# Patient Record
Sex: Female | Born: 1980 | Race: Black or African American | Hispanic: No | Marital: Single | Smoking: Current some day smoker
Health system: Southern US, Community
[De-identification: ages and names within clinical notes are randomized; demographics above are authoritative.]

## PROBLEM LIST (undated history)

## (undated) ENCOUNTER — Inpatient Hospital Stay (HOSPITAL_COMMUNITY): Payer: Self-pay

## (undated) DIAGNOSIS — E079 Disorder of thyroid, unspecified: Secondary | ICD-10-CM

## (undated) DIAGNOSIS — E669 Obesity, unspecified: Secondary | ICD-10-CM

## (undated) DIAGNOSIS — D649 Anemia, unspecified: Secondary | ICD-10-CM

## (undated) DIAGNOSIS — D573 Sickle-cell trait: Secondary | ICD-10-CM

## (undated) HISTORY — PX: WISDOM TOOTH EXTRACTION: SHX21

## (undated) HISTORY — PX: FINGER NAIL SURGERY: SHX717

## (undated) HISTORY — PX: KNEE SURGERY: SHX244

---

## 1999-01-22 ENCOUNTER — Inpatient Hospital Stay (HOSPITAL_COMMUNITY): Admission: AD | Admit: 1999-01-22 | Discharge: 1999-01-22 | Payer: Self-pay | Admitting: *Deleted

## 1999-03-28 ENCOUNTER — Emergency Department (HOSPITAL_COMMUNITY): Admission: EM | Admit: 1999-03-28 | Discharge: 1999-03-28 | Payer: Self-pay | Admitting: Emergency Medicine

## 2000-08-16 ENCOUNTER — Emergency Department (HOSPITAL_COMMUNITY): Admission: EM | Admit: 2000-08-16 | Discharge: 2000-08-16 | Payer: Self-pay | Admitting: *Deleted

## 2000-12-05 ENCOUNTER — Inpatient Hospital Stay (HOSPITAL_COMMUNITY): Admission: AD | Admit: 2000-12-05 | Discharge: 2000-12-05 | Payer: Self-pay | Admitting: Obstetrics

## 2001-05-22 ENCOUNTER — Encounter: Payer: Self-pay | Admitting: Family Medicine

## 2001-05-22 ENCOUNTER — Ambulatory Visit (HOSPITAL_COMMUNITY): Admission: RE | Admit: 2001-05-22 | Discharge: 2001-05-22 | Payer: Self-pay | Admitting: Family Medicine

## 2001-10-18 ENCOUNTER — Emergency Department (HOSPITAL_COMMUNITY): Admission: EM | Admit: 2001-10-18 | Discharge: 2001-10-18 | Payer: Self-pay | Admitting: *Deleted

## 2001-10-28 ENCOUNTER — Emergency Department (HOSPITAL_COMMUNITY): Admission: EM | Admit: 2001-10-28 | Discharge: 2001-10-28 | Payer: Self-pay | Admitting: Emergency Medicine

## 2002-02-15 ENCOUNTER — Inpatient Hospital Stay (HOSPITAL_COMMUNITY): Admission: AD | Admit: 2002-02-15 | Discharge: 2002-02-15 | Payer: Self-pay | Admitting: *Deleted

## 2002-02-18 ENCOUNTER — Other Ambulatory Visit: Admission: RE | Admit: 2002-02-18 | Discharge: 2002-02-18 | Payer: Self-pay | Admitting: Obstetrics and Gynecology

## 2002-12-28 ENCOUNTER — Emergency Department (HOSPITAL_COMMUNITY): Admission: EM | Admit: 2002-12-28 | Discharge: 2002-12-28 | Payer: Self-pay | Admitting: Emergency Medicine

## 2003-08-24 ENCOUNTER — Inpatient Hospital Stay (HOSPITAL_COMMUNITY): Admission: AD | Admit: 2003-08-24 | Discharge: 2003-08-24 | Payer: Self-pay | Admitting: *Deleted

## 2003-08-26 ENCOUNTER — Inpatient Hospital Stay (HOSPITAL_COMMUNITY): Admission: AD | Admit: 2003-08-26 | Discharge: 2003-08-26 | Payer: Self-pay | Admitting: Obstetrics and Gynecology

## 2003-08-31 ENCOUNTER — Inpatient Hospital Stay (HOSPITAL_COMMUNITY): Admission: AD | Admit: 2003-08-31 | Discharge: 2003-08-31 | Payer: Self-pay | Admitting: Obstetrics & Gynecology

## 2003-12-09 ENCOUNTER — Inpatient Hospital Stay (HOSPITAL_COMMUNITY): Admission: AD | Admit: 2003-12-09 | Discharge: 2003-12-09 | Payer: Self-pay | Admitting: Obstetrics

## 2004-02-12 ENCOUNTER — Inpatient Hospital Stay (HOSPITAL_COMMUNITY): Admission: AD | Admit: 2004-02-12 | Discharge: 2004-02-12 | Payer: Self-pay | Admitting: Obstetrics

## 2004-03-05 ENCOUNTER — Inpatient Hospital Stay (HOSPITAL_COMMUNITY): Admission: AD | Admit: 2004-03-05 | Discharge: 2004-03-07 | Payer: Self-pay | Admitting: Obstetrics

## 2004-03-13 ENCOUNTER — Inpatient Hospital Stay (HOSPITAL_COMMUNITY): Admission: AD | Admit: 2004-03-13 | Discharge: 2004-03-15 | Payer: Self-pay | Admitting: Obstetrics

## 2004-03-21 ENCOUNTER — Ambulatory Visit (HOSPITAL_COMMUNITY): Admission: RE | Admit: 2004-03-21 | Discharge: 2004-03-21 | Payer: Self-pay | Admitting: Obstetrics

## 2004-03-21 ENCOUNTER — Observation Stay (HOSPITAL_COMMUNITY): Admission: AD | Admit: 2004-03-21 | Discharge: 2004-03-22 | Payer: Self-pay | Admitting: Obstetrics

## 2004-03-22 ENCOUNTER — Inpatient Hospital Stay (HOSPITAL_COMMUNITY): Admission: AD | Admit: 2004-03-22 | Discharge: 2004-03-24 | Payer: Self-pay | Admitting: Obstetrics

## 2004-07-26 ENCOUNTER — Inpatient Hospital Stay (HOSPITAL_COMMUNITY): Admission: AD | Admit: 2004-07-26 | Discharge: 2004-07-27 | Payer: Self-pay | Admitting: Obstetrics

## 2005-02-23 ENCOUNTER — Emergency Department (HOSPITAL_COMMUNITY): Admission: EM | Admit: 2005-02-23 | Discharge: 2005-02-23 | Payer: Self-pay | Admitting: Family Medicine

## 2005-02-25 ENCOUNTER — Inpatient Hospital Stay (HOSPITAL_COMMUNITY): Admission: AD | Admit: 2005-02-25 | Discharge: 2005-02-25 | Payer: Self-pay | Admitting: Obstetrics and Gynecology

## 2005-03-08 ENCOUNTER — Other Ambulatory Visit: Admission: RE | Admit: 2005-03-08 | Discharge: 2005-03-08 | Payer: Self-pay | Admitting: Obstetrics and Gynecology

## 2005-04-26 ENCOUNTER — Inpatient Hospital Stay (HOSPITAL_COMMUNITY): Admission: AD | Admit: 2005-04-26 | Discharge: 2005-04-26 | Payer: Self-pay | Admitting: Obstetrics and Gynecology

## 2005-05-17 ENCOUNTER — Emergency Department (HOSPITAL_COMMUNITY): Admission: EM | Admit: 2005-05-17 | Discharge: 2005-05-17 | Payer: Self-pay | Admitting: Emergency Medicine

## 2005-06-25 ENCOUNTER — Inpatient Hospital Stay (HOSPITAL_COMMUNITY): Admission: AD | Admit: 2005-06-25 | Discharge: 2005-06-25 | Payer: Self-pay | Admitting: Obstetrics and Gynecology

## 2005-09-18 ENCOUNTER — Inpatient Hospital Stay (HOSPITAL_COMMUNITY): Admission: AD | Admit: 2005-09-18 | Discharge: 2005-09-18 | Payer: Self-pay | Admitting: Obstetrics and Gynecology

## 2005-10-09 ENCOUNTER — Inpatient Hospital Stay (HOSPITAL_COMMUNITY): Admission: AD | Admit: 2005-10-09 | Discharge: 2005-10-10 | Payer: Self-pay | Admitting: Obstetrics and Gynecology

## 2005-10-15 ENCOUNTER — Inpatient Hospital Stay (HOSPITAL_COMMUNITY): Admission: AD | Admit: 2005-10-15 | Discharge: 2005-10-15 | Payer: Self-pay | Admitting: Obstetrics and Gynecology

## 2005-10-24 ENCOUNTER — Inpatient Hospital Stay (HOSPITAL_COMMUNITY): Admission: AD | Admit: 2005-10-24 | Discharge: 2005-10-26 | Payer: Self-pay | Admitting: Obstetrics and Gynecology

## 2006-03-05 ENCOUNTER — Other Ambulatory Visit: Admission: RE | Admit: 2006-03-05 | Discharge: 2006-03-05 | Payer: Self-pay | Admitting: Obstetrics and Gynecology

## 2006-04-12 ENCOUNTER — Inpatient Hospital Stay (HOSPITAL_COMMUNITY): Admission: AD | Admit: 2006-04-12 | Discharge: 2006-04-12 | Payer: Self-pay | Admitting: Obstetrics and Gynecology

## 2006-06-10 ENCOUNTER — Inpatient Hospital Stay (HOSPITAL_COMMUNITY): Admission: AD | Admit: 2006-06-10 | Discharge: 2006-06-10 | Payer: Self-pay | Admitting: Obstetrics and Gynecology

## 2006-07-17 ENCOUNTER — Ambulatory Visit (HOSPITAL_COMMUNITY): Admission: RE | Admit: 2006-07-17 | Discharge: 2006-07-17 | Payer: Self-pay | Admitting: Obstetrics and Gynecology

## 2006-07-26 ENCOUNTER — Inpatient Hospital Stay (HOSPITAL_COMMUNITY): Admission: AD | Admit: 2006-07-26 | Discharge: 2006-07-26 | Payer: Self-pay | Admitting: Obstetrics and Gynecology

## 2006-08-24 ENCOUNTER — Inpatient Hospital Stay (HOSPITAL_COMMUNITY): Admission: AD | Admit: 2006-08-24 | Discharge: 2006-08-24 | Payer: Self-pay | Admitting: Obstetrics and Gynecology

## 2006-09-09 ENCOUNTER — Inpatient Hospital Stay (HOSPITAL_COMMUNITY): Admission: AD | Admit: 2006-09-09 | Discharge: 2006-09-10 | Payer: Self-pay | Admitting: Obstetrics and Gynecology

## 2006-09-13 ENCOUNTER — Inpatient Hospital Stay (HOSPITAL_COMMUNITY): Admission: AD | Admit: 2006-09-13 | Discharge: 2006-09-14 | Payer: Self-pay | Admitting: Obstetrics and Gynecology

## 2006-09-22 ENCOUNTER — Inpatient Hospital Stay (HOSPITAL_COMMUNITY): Admission: AD | Admit: 2006-09-22 | Discharge: 2006-09-22 | Payer: Self-pay | Admitting: Obstetrics and Gynecology

## 2006-10-09 ENCOUNTER — Inpatient Hospital Stay (HOSPITAL_COMMUNITY): Admission: AD | Admit: 2006-10-09 | Discharge: 2006-10-09 | Payer: Self-pay | Admitting: Obstetrics and Gynecology

## 2006-10-16 ENCOUNTER — Inpatient Hospital Stay (HOSPITAL_COMMUNITY): Admission: RE | Admit: 2006-10-16 | Discharge: 2006-10-18 | Payer: Self-pay | Admitting: Obstetrics and Gynecology

## 2006-11-27 ENCOUNTER — Other Ambulatory Visit: Admission: RE | Admit: 2006-11-27 | Discharge: 2006-11-27 | Payer: Self-pay | Admitting: Obstetrics and Gynecology

## 2007-12-20 ENCOUNTER — Inpatient Hospital Stay (HOSPITAL_COMMUNITY): Admission: AD | Admit: 2007-12-20 | Discharge: 2007-12-20 | Payer: Self-pay | Admitting: Obstetrics and Gynecology

## 2008-03-17 ENCOUNTER — Emergency Department (HOSPITAL_COMMUNITY): Admission: EM | Admit: 2008-03-17 | Discharge: 2008-03-17 | Payer: Self-pay | Admitting: Emergency Medicine

## 2008-03-17 ENCOUNTER — Emergency Department (HOSPITAL_COMMUNITY): Admission: EM | Admit: 2008-03-17 | Discharge: 2008-03-17 | Payer: Self-pay | Admitting: Family Medicine

## 2008-03-20 ENCOUNTER — Emergency Department (HOSPITAL_COMMUNITY): Admission: EM | Admit: 2008-03-20 | Discharge: 2008-03-20 | Payer: Self-pay | Admitting: Emergency Medicine

## 2008-07-07 ENCOUNTER — Emergency Department (HOSPITAL_COMMUNITY): Admission: EM | Admit: 2008-07-07 | Discharge: 2008-07-07 | Payer: Self-pay | Admitting: Family Medicine

## 2008-12-07 ENCOUNTER — Inpatient Hospital Stay (HOSPITAL_COMMUNITY): Admission: AD | Admit: 2008-12-07 | Discharge: 2008-12-07 | Payer: Self-pay | Admitting: Obstetrics & Gynecology

## 2010-06-13 ENCOUNTER — Inpatient Hospital Stay (HOSPITAL_COMMUNITY): Admission: AD | Admit: 2010-06-13 | Discharge: 2010-06-13 | Payer: Self-pay | Admitting: Obstetrics

## 2010-06-13 ENCOUNTER — Ambulatory Visit: Payer: Self-pay | Admitting: Nurse Practitioner

## 2010-09-17 ENCOUNTER — Encounter: Payer: Self-pay | Admitting: Obstetrics and Gynecology

## 2010-09-18 ENCOUNTER — Encounter: Payer: Self-pay | Admitting: Obstetrics

## 2010-09-20 ENCOUNTER — Inpatient Hospital Stay (HOSPITAL_COMMUNITY)
Admission: AD | Admit: 2010-09-20 | Discharge: 2010-09-20 | Payer: Self-pay | Source: Home / Self Care | Attending: Obstetrics | Admitting: Obstetrics

## 2010-09-21 LAB — CBC
Hemoglobin: 9.6 g/dL — ABNORMAL LOW (ref 12.0–15.0)
Platelets: 268 10*3/uL (ref 150–400)
RBC: 4.06 MIL/uL (ref 3.87–5.11)
WBC: 10.2 10*3/uL (ref 4.0–10.5)

## 2010-09-21 LAB — URINE MICROSCOPIC-ADD ON

## 2010-09-21 LAB — URINALYSIS, ROUTINE W REFLEX MICROSCOPIC
Bilirubin Urine: NEGATIVE
Hgb urine dipstick: NEGATIVE
Specific Gravity, Urine: 1.015 (ref 1.005–1.030)
Urine Glucose, Fasting: NEGATIVE mg/dL
Urobilinogen, UA: 2 mg/dL — ABNORMAL HIGH (ref 0.0–1.0)
pH: 6 (ref 5.0–8.0)

## 2010-09-21 LAB — COMPREHENSIVE METABOLIC PANEL
ALT: 9 U/L (ref 0–35)
AST: 15 U/L (ref 0–37)
Albumin: 2.6 g/dL — ABNORMAL LOW (ref 3.5–5.2)
CO2: 18 mEq/L — ABNORMAL LOW (ref 19–32)
Chloride: 110 mEq/L (ref 96–112)
GFR calc Af Amer: >60 mL/min (ref >60)
GFR calc non Af Amer: >60 mL/min (ref >60)
Sodium: 138 mEq/L (ref 135–145)
Total Bilirubin: 0.6 mg/dL (ref 0.3–1.2)

## 2010-09-29 ENCOUNTER — Emergency Department (HOSPITAL_COMMUNITY)
Admission: EM | Admit: 2010-09-29 | Discharge: 2010-09-29 | Disposition: A | Discharge status: Nursing Home (Includes ICF) | Payer: Medicaid Other | Attending: Emergency Medicine | Admitting: Emergency Medicine

## 2010-09-29 ENCOUNTER — Inpatient Hospital Stay (HOSPITAL_COMMUNITY)
Admission: AD | Admit: 2010-09-29 | Discharge: 2010-09-29 | Disposition: A | Discharge status: Home / Self Care | Payer: Medicaid Other | Source: Ambulatory Visit | Attending: Obstetrics | Admitting: Obstetrics

## 2010-09-29 DIAGNOSIS — D573 Sickle-cell trait: Secondary | ICD-10-CM | POA: Insufficient documentation

## 2010-09-29 DIAGNOSIS — O99891 Other specified diseases and conditions complicating pregnancy: Secondary | ICD-10-CM | POA: Insufficient documentation

## 2010-09-29 DIAGNOSIS — Y92009 Unspecified place in unspecified non-institutional (private) residence as the place of occurrence of the external cause: Secondary | ICD-10-CM | POA: Insufficient documentation

## 2010-09-29 DIAGNOSIS — R109 Unspecified abdominal pain: Secondary | ICD-10-CM

## 2010-09-29 DIAGNOSIS — O368190 Decreased fetal movements, unspecified trimester, not applicable or unspecified: Secondary | ICD-10-CM

## 2010-09-29 DIAGNOSIS — O9989 Other specified diseases and conditions complicating pregnancy, childbirth and the puerperium: Secondary | ICD-10-CM

## 2010-09-29 DIAGNOSIS — IMO0002 Reserved for concepts with insufficient information to code with codable children: Secondary | ICD-10-CM | POA: Insufficient documentation

## 2010-09-29 DIAGNOSIS — Y929 Unspecified place or not applicable: Secondary | ICD-10-CM | POA: Insufficient documentation

## 2010-09-29 LAB — WET PREP, GENITAL
Trich, Wet Prep: NONE SEEN
Yeast Wet Prep HPF POC: NONE SEEN

## 2010-11-02 ENCOUNTER — Inpatient Hospital Stay (HOSPITAL_COMMUNITY)
Admission: AD | Admit: 2010-11-02 | Discharge: 2010-11-03 | Disposition: A | Discharge status: Home / Self Care | Payer: Medicaid Other | Source: Ambulatory Visit | Attending: Obstetrics | Admitting: Obstetrics

## 2010-11-02 DIAGNOSIS — O479 False labor, unspecified: Secondary | ICD-10-CM

## 2010-11-03 LAB — URINALYSIS, ROUTINE W REFLEX MICROSCOPIC
Glucose, UA: NEGATIVE mg/dL
Ketones, ur: >80 mg/dL — AB
Nitrite: NEGATIVE
Protein, ur: NEGATIVE mg/dL
pH: 6 (ref 5.0–8.0)

## 2010-11-03 LAB — CBC
HCT: 29.4 % — ABNORMAL LOW (ref 36.0–46.0)
Hemoglobin: 9.7 g/dL — ABNORMAL LOW (ref 12.0–15.0)
MCH: 22.6 pg — ABNORMAL LOW (ref 26.0–34.0)
MCHC: 33 g/dL (ref 30.0–36.0)
RDW: 14.4 % (ref 11.5–15.5)

## 2010-11-03 LAB — URINE MICROSCOPIC-ADD ON

## 2010-11-03 LAB — COMPREHENSIVE METABOLIC PANEL
BUN: 1 mg/dL — ABNORMAL LOW (ref 6–23)
CO2: 20 mEq/L (ref 19–32)
Calcium: 8.6 mg/dL (ref 8.4–10.5)
Creatinine, Ser: 0.55 mg/dL (ref 0.4–1.2)
GFR calc non Af Amer: >60 mL/min (ref >60)
Glucose, Bld: 91 mg/dL (ref 70–99)

## 2010-11-03 LAB — LACTATE DEHYDROGENASE: LDH: 140 U/L (ref 94–250)

## 2010-11-06 ENCOUNTER — Emergency Department (HOSPITAL_COMMUNITY)
Admission: EM | Admit: 2010-11-06 | Discharge: 2010-11-06 | Disposition: A | Discharge status: Home / Self Care | Payer: Medicaid Other | Attending: Emergency Medicine | Admitting: Emergency Medicine

## 2010-11-06 DIAGNOSIS — R220 Localized swelling, mass and lump, head: Secondary | ICD-10-CM | POA: Insufficient documentation

## 2010-11-06 DIAGNOSIS — K089 Disorder of teeth and supporting structures, unspecified: Secondary | ICD-10-CM | POA: Insufficient documentation

## 2010-11-06 DIAGNOSIS — D573 Sickle-cell trait: Secondary | ICD-10-CM | POA: Insufficient documentation

## 2010-11-06 DIAGNOSIS — O99891 Other specified diseases and conditions complicating pregnancy: Secondary | ICD-10-CM | POA: Insufficient documentation

## 2010-11-06 DIAGNOSIS — R221 Localized swelling, mass and lump, neck: Secondary | ICD-10-CM | POA: Insufficient documentation

## 2010-11-09 LAB — COMPREHENSIVE METABOLIC PANEL
AST: 20 U/L (ref 0–37)
Albumin: 2.9 g/dL — ABNORMAL LOW (ref 3.5–5.2)
Calcium: 8.7 mg/dL (ref 8.4–10.5)
Chloride: 106 mEq/L (ref 96–112)
Creatinine, Ser: 0.6 mg/dL (ref 0.4–1.2)
GFR calc Af Amer: >60 mL/min (ref >60)
Total Bilirubin: 0.4 mg/dL (ref 0.3–1.2)
Total Protein: 6.5 g/dL (ref 6.0–8.3)

## 2010-11-09 LAB — URINE CULTURE
Colony Count: >=100000
Culture  Setup Time: 201110180343

## 2010-11-09 LAB — DIFFERENTIAL
Eosinophils Relative: 2 % (ref 0–5)
Lymphocytes Relative: 24 % (ref 12–46)
Lymphs Abs: 2.1 10*3/uL (ref 0.7–4.0)
Monocytes Absolute: 0.4 10*3/uL (ref 0.1–1.0)
Monocytes Relative: 5 % (ref 3–12)
Neutro Abs: 6.2 10*3/uL (ref 1.7–7.7)

## 2010-11-09 LAB — CBC
MCH: 26 pg (ref 26.0–34.0)
Platelets: 249 10*3/uL (ref 150–400)
RBC: 3.92 MIL/uL (ref 3.87–5.11)
RDW: 14 % (ref 11.5–15.5)

## 2010-11-09 LAB — URINALYSIS, ROUTINE W REFLEX MICROSCOPIC
Protein, ur: NEGATIVE mg/dL
Urobilinogen, UA: 1 mg/dL (ref 0.0–1.0)

## 2010-11-09 LAB — WET PREP, GENITAL

## 2010-11-09 LAB — GC/CHLAMYDIA PROBE AMP, GENITAL
Chlamydia, DNA Probe: NEGATIVE
GC Probe Amp, Genital: NEGATIVE

## 2010-11-09 LAB — URINE MICROSCOPIC-ADD ON

## 2010-11-23 ENCOUNTER — Inpatient Hospital Stay (HOSPITAL_COMMUNITY)
Admission: RE | Admit: 2010-11-23 | Discharge: 2010-11-25 | DRG: 775 | Disposition: A | Discharge status: Home / Self Care | Payer: Medicaid Other | Source: Ambulatory Visit | Attending: Obstetrics | Admitting: Obstetrics

## 2010-11-23 DIAGNOSIS — O99892 Other specified diseases and conditions complicating childbirth: Principal | ICD-10-CM | POA: Diagnosis present

## 2010-11-23 DIAGNOSIS — Z22330 Carrier of Group B streptococcus: Secondary | ICD-10-CM

## 2010-11-23 LAB — CBC
Platelets: 226 10*3/uL (ref 150–400)
RBC: 4.16 MIL/uL (ref 3.87–5.11)
RDW: 15.4 % (ref 11.5–15.5)
WBC: 8.4 10*3/uL (ref 4.0–10.5)

## 2010-11-23 LAB — RPR: RPR Ser Ql: NONREACTIVE

## 2010-11-24 LAB — CBC
Hemoglobin: 7.4 g/dL — ABNORMAL LOW (ref 12.0–15.0)
MCH: 21.5 pg — ABNORMAL LOW (ref 26.0–34.0)
Platelets: 190 10*3/uL (ref 150–400)
RBC: 3.44 MIL/uL — ABNORMAL LOW (ref 3.87–5.11)

## 2010-12-07 LAB — URINALYSIS, ROUTINE W REFLEX MICROSCOPIC
Glucose, UA: NEGATIVE mg/dL
Ketones, ur: NEGATIVE mg/dL
Leukocytes, UA: NEGATIVE
Nitrite: POSITIVE — AB
Protein, ur: NEGATIVE mg/dL
pH: 6.5 (ref 5.0–8.0)

## 2010-12-07 LAB — WET PREP, GENITAL
Trich, Wet Prep: NONE SEEN
Yeast Wet Prep HPF POC: NONE SEEN

## 2010-12-07 LAB — URINE MICROSCOPIC-ADD ON

## 2010-12-08 LAB — POCT PREGNANCY, URINE: Preg Test, Ur: NEGATIVE

## 2011-01-13 NOTE — Discharge Summary (Signed)
Megan [name], BALDRIDGE           ACCOUNT NO.:  [id number]   MEDICAL RECORD NO.:  [id number]          PATIENT TYPE:  INP   LOCATION:  9116                          [facility]   PHYSICIAN:  James A. Ashley Royalty, M.D.DATE OF BIRTH:  03-16-1981   DATE OF ADMISSION:  10/24/2005  DATE OF DISCHARGE:  10/26/2005                                 DISCHARGE SUMMARY   DISCHARGE DIAGNOSES:  1.  Intrauterine pregnancy at 39 weeks, 5 days gestation, delivered.  2.  Sickle trait.  3.  Heterozygote for cystic fibrosis.  4.  Muscular discomfort.  5.  Term birth living child vertex.   OPERATIONS AND PROCEDURES:  OB delivery.   CONSULTATIONS:  None.   DISCHARGE MEDICATIONS:  1.  Motrin 600 mg.  2.  Chromagen.   HISTORY AND PHYSICAL:  This is a 30 year old gravida 2, para 1 at 39 weeks 5  days gestation. Interim care was complicated by sickle trait and positive  carrier status for cystic fibrosis. Father of the baby was not available for  any testing. The patient was scheduled for induction due to musculoskeletal  discomfort with advanced cervical changes at term. However, she presented  October 24, 2005 approximately 5 a.m. complaining of regular contractions.  She was noted to be 5 cm dilated in maternity admissions. For the remainder  of the history and physical, please see chart.   HOSPITAL COURSE:  The patient was admitted to [facility] for  [place]. Admission laboratory studies were drawn. She went on to labor  and deliver on October 24, 2005. It is a 7-pound, 11-ounce female, Apgars 7  at one minute, 9 at five minutes, sent to the newborn nursery. Delivery was  accomplished by Dr. Richardson Dopp over an intact perineum. There were no lacerations  or other complications. The patient's postpartum course was benign. She was  discharged the second postpartum day afebrile and in satisfactory condition.   DISPOSITION:  The patient is to return to [facility] and Obstetrics  in four to  six weeks for postpartum evaluation.      James A. Ashley Royalty, M.D.  Electronically Signed     JAM/MEDQ  D:  11/27/2005  T:  11/29/2005  Job:  [id number]

## 2011-01-13 NOTE — Discharge Summary (Signed)
NAMESWAN, FAIRFAX                     ACCOUNT NO.:  [id number]   MEDICAL RECORD NO.:  [id number]                   PATIENT TYPE:  INP   LOCATION:  9143                                 [facility]   PHYSICIAN:  Kathreen Cosier, M.D.           DATE OF BIRTH:  23-Sep-1980   DATE OF ADMISSION:  03/05/2004  DATE OF DISCHARGE:  03/07/2004                                 DISCHARGE SUMMARY   HOSPITAL COURSE:  The patient is a 30 year old gravida 1 at [redacted] weeks  gestation who was admitted with premature contractions.  Her labs are  normal, she had no decelerations, and she also had urinary tract infection.  She received IV hydration and IV Rocephin and her contractions ceased.  On  March 07, 2004 she had an ultrasound which was normal, and she was discharged  home on Macrobid one p.o. b.i.d. for 7 days, Procardia 60 mg p.o. daily for  premature contractions.   DISCHARGE DIAGNOSIS:  Status post intrauterine pregnancy at 33+ weeks with  urinary tract infection and premature contractions.                                               Kathreen Cosier, M.D.    BAM/MEDQ  D:  05/04/2004  T:  05/04/2004  Job:  [id number]

## 2011-01-13 NOTE — Discharge Summary (Signed)
Megan [name], Megan [name]           ACCOUNT NO.:  [id number]   MEDICAL RECORD NO.:  [id number]          PATIENT TYPE:  INP   LOCATION:  9136                          [facility]   PHYSICIAN:  James A. Ashley Royalty, M.D.DATE OF BIRTH:  1981-03-11   DATE OF ADMISSION:  10/16/2006  DATE OF DISCHARGE:  10/18/2006                               DISCHARGE SUMMARY   DISCHARGE DIAGNOSES:  1. Intrauterine pregnancy at 35 weeks' gestation, delivered.  2. History of noncompliance with obstetrical/gynecological care.  3. Term-birth living child, vertex.   OPERATIONS AND PROCEDURES:  OB delivery.   CONSULTATIONS:  None.   DISCHARGE MEDICATIONS:  1. Iron.  2. Motrin 600 mg.   HISTORY AND PHYSICAL:  This is a 23-year gravida 3, para 2 at 39-weeks 1-  day gestation.  Prenatal care complicated by the aforementioned  diagnoses as well as sickle trait.  The patient was admitted for  induction secondary to advanced cervical changes and noncompliance with  OB/GYN care.  For the remainder of the history and physical, please see chart.   HOSPITAL COURSE:  The patient was admitted to the [facility] of  [place].  Admission laboratory studies were drawn.  Artificial  rupture of membranes was accomplished.  She went on to labor and deliver  on October 16, 2006.  The infant was a 6-pound 15-ounce female, Apgars 8  at one minute and 9 at five minutes to the newborn nursery.  Delivery  was accomplished by Dr. Sylvester Harder over an intact perineum.  There  was a first-degree periurethral laceration which was repaired without  difficulty.  The patient's postpartum course was benign save for a  modest asymptomatic anemia.  She was felt to be stable for discharge on  October 18, 2006 and was discharged home afebrile and in satisfactory  condition.   DISPOSITION:  The patient is to return to [facility] and  Obstetrics in 6 weeks for postpartum evaluation.      James A. Ashley Royalty, M.D.  Electronically Signed     JAM/MEDQ  D:  11/28/2006  T:  11/28/2006  Job:  [id number]

## 2011-01-13 NOTE — Discharge Summary (Signed)
NAMETENELLE, ANDREASON                     ACCOUNT NO.:  [id number]   MEDICAL RECORD NO.:  [id number]                   PATIENT TYPE:  INP   LOCATION:  9160                                 [facility]   PHYSICIAN:  Kathreen Cosier, M.D.           DATE OF BIRTH:  [birth year]   DATE OF ADMISSION:  03/13/2004  DATE OF DISCHARGE:  03/15/2004                                 DISCHARGE SUMMARY   HOSPITAL COURSE:  The patient is a 30 year old gravida 1 with EDC April 17, 2004 at 35 weeks 1 day and came in for premature contractions.  She was  placed on magnesium sulfate.  Her cervix was 1-2 cm, vertex, -3.  She was  also on Macrobid for a urinary tract infection.  By July 18 her contractions  had decreased and the magnesium sulfate was discontinued.  She was placed on  terbutaline 0.25 subcu x1 and then 2.5 p.o. q.4h.  She was discharged on  March 15, 2004 on terbutaline 2.5 mg p.o. q.4h., to see me in 1 week.   DISCHARGE DIAGNOSIS:  Status post hospitalization for premature  contractions.                                               Kathreen Cosier, M.D.    BAM/MEDQ  D:  04/20/2004  T:  04/20/2004  Job:  [id number]

## 2011-01-13 NOTE — H&P (Signed)
NAMEKEYSHA, DAMEWOOD           ACCOUNT NO.:  [id number]   MEDICAL RECORD NO.:  [id number]           PATIENT TYPE:   LOCATION:                                 FACILITY:   PHYSICIAN:  James A. Ashley Royalty, M.D.     DATE OF BIRTH:   DATE OF ADMISSION:  10/24/2005  DATE OF DISCHARGE:                                HISTORY & PHYSICAL   HISTORY OF PRESENT ILLNESS:  A 30 year old gravida 2, para 1, EDC October 26, 2005, 39 weeks, 5 days gestation.  Prenatal care complicated by sickle trait  and positive carrier status for cystic fibrosis, Delta 507 mutation.  The  father of the baby was not available for any testing.  The patient presented  to the office on December 21, 2005 complaining of musculoskeletal discomfort.  She was noted to be 3 cm dilated, 80% effaced, -2 station, and she is  admitted for induction.  She is group B strep negative.   MEDICATIONS:  Vitamins.   PAST MEDICAL HISTORY:   MEDICAL:  Negative.   SURGICAL:  Negative.   ALLERGIES:  None.   FAMILY HISTORY:  Noncontributory.   SOCIAL HISTORY:  The patient denies the use of tobacco or significant  alcohol.   REVIEW OF SYSTEMS:  Noncontributory.   PHYSICAL EXAMINATION:  GENERAL:  Well-developed, well-nourished obese black  female in no acute distress.  VITAL SIGNS:  Afebrile.  Vital signs stable.  CHEST:  Lungs are clear.  CARDIAC:  Regular rate and rhythm.  ABDOMEN:  Gravid with a term fundal height.  Fetal heart tones are  auscultated.  MUSCULOSKELETAL:  No CVA tenderness.  PELVIC:  External genitalia within normal limits.  Vagina and cervix without  gross lesions.  Cervix is dilated, as mentioned above.  The presenting part  was uncertain.   Ultrasound was performed which revealed a vertex presentation.   IMPRESSION:  1.  Intrauterine pregnancy at 39 weeks, 5 days gestation.  2.  Sickle trait.  3.  Heterozygon for cystic fibrosis - Delta 507 mutation.  4.  Musculoskeletal discomfort.  5.  Advanced cervical  changes at term.   PLAN:  1.  Admit.  2.  Induction of labor.  3.  The patient is group B strep negative.      James A. Ashley Royalty, M.D.  Electronically Signed     JAM/MEDQ  D:  10/23/2005  T:  10/23/2005  Job:  [id number]

## 2011-05-23 LAB — URINALYSIS, ROUTINE W REFLEX MICROSCOPIC
Bilirubin Urine: NEGATIVE
Ketones, ur: NEGATIVE
Nitrite: NEGATIVE
Protein, ur: NEGATIVE
Urobilinogen, UA: 0.2
pH: 5.5

## 2011-05-23 LAB — WET PREP, GENITAL
Clue Cells Wet Prep HPF POC: NONE SEEN
Trich, Wet Prep: NONE SEEN

## 2011-05-26 LAB — GC/CHLAMYDIA PROBE AMP, GENITAL: Chlamydia, DNA Probe: NEGATIVE

## 2011-05-26 LAB — WET PREP, GENITAL
Clue Cells Wet Prep HPF POC: NONE SEEN
Trich, Wet Prep: NONE SEEN
Yeast Wet Prep HPF POC: NONE SEEN

## 2011-05-30 LAB — POCT URINALYSIS DIP (DEVICE)
Bilirubin Urine: NEGATIVE
Hgb urine dipstick: NEGATIVE
Ketones, ur: NEGATIVE mg/dL
Protein, ur: NEGATIVE mg/dL
pH: 7 (ref 5.0–8.0)

## 2011-05-30 LAB — GC/CHLAMYDIA PROBE AMP, GENITAL
Chlamydia, DNA Probe: NEGATIVE
GC Probe Amp, Genital: NEGATIVE

## 2011-05-30 LAB — WET PREP, GENITAL

## 2011-11-26 ENCOUNTER — Emergency Department (HOSPITAL_COMMUNITY)
Admission: EM | Admit: 2011-11-26 | Discharge: 2011-11-26 | Disposition: A | Discharge status: Home / Self Care | Payer: Medicaid Other | Attending: Emergency Medicine | Admitting: Emergency Medicine

## 2011-11-26 ENCOUNTER — Encounter (HOSPITAL_COMMUNITY): Payer: Self-pay | Admitting: Emergency Medicine

## 2011-11-26 DIAGNOSIS — W2209XA Striking against other stationary object, initial encounter: Secondary | ICD-10-CM | POA: Insufficient documentation

## 2011-11-26 DIAGNOSIS — S61309A Unspecified open wound of unspecified finger with damage to nail, initial encounter: Secondary | ICD-10-CM

## 2011-11-26 DIAGNOSIS — S61209A Unspecified open wound of unspecified finger without damage to nail, initial encounter: Secondary | ICD-10-CM | POA: Insufficient documentation

## 2011-11-26 MED ORDER — HYDROMORPHONE HCL PF 1 MG/ML IJ SOLN
INTRAMUSCULAR | Status: AC
  Filled 2011-11-26: qty 1

## 2011-11-26 MED ORDER — FENTANYL CITRATE 0.05 MG/ML IJ SOLN
INTRAMUSCULAR | Status: AC
  Filled 2011-11-26: qty 2

## 2011-11-26 NOTE — ED Notes (Signed)
Pt st's she was reaching into refrigerator and hit left little finger.  Pt nail pulled loose from finger

## 2011-11-26 NOTE — ED Notes (Signed)
Patient is AOx4 and comfortable with her discharge instructions. 

## 2011-11-26 NOTE — Discharge Instructions (Signed)
Fingernail Removal Fingernails may need to be removed because of injury, infections, or correction of abnormal growth. A special non-stick bandage has been put on your finger tightly to prevent bleeding. Fingernails will usually grow back if the finger has not been badly injured and you carefully follow instructions. HOME CARE INSTRUCTIONS   Keep your hand elevated above your heart to relieve pain and swelling.   Keep your dressing dry and clean.   Change your bandage in 24 hours.   After your bandage is changed, soak your hand in warm soapy water for 10 to 20 minutes. Do this 3 times per day. This helps reduce pain and swelling. After soaking your hand, apply a clean, dry bandage. Change your bandage if it is wet or dirty.   Only take over-the-counter or prescription medicines for pain, discomfort, or fever as directed by your caregiver.   See your caregiver as needed for problems.   You may have received an instruction to follow up with your caregiver or a specialist. The failure to follow up as instructed could result in the permanent loss of a fingernail.  SEEK IMMEDIATE MEDICAL CARE IF:   You have increased pain, swelling, drainage, or bleeding.   You have a fever.  MAKE SURE YOU:   Understand these instructions.   Will watch your condition.   Will get help right away if you are not doing well or get worse.  Document Released: 08/11/2000 Document Revised: 08/03/2011 Document Reviewed: 12/17/2007 [facility] Patient Information 2012 [place], [place].

## 2011-11-26 NOTE — ED Provider Notes (Signed)
History     CSN: [id number]  Arrival date & time 11/26/11  0224   First MD Initiated Contact with Patient 11/26/11 0244      Chief Complaint  Patient presents with  . Finger Injury    (Consider location/radiation/quality/duration/timing/severity/associated sxs/prior treatment) HPI Comments: Patient here after striking her left small finger on the refrigerator door - reports this pulled part of the nail off.  No injury to the finger itself - no fever, chills, is able to bend the finger.  Patient is a 31 y.o. female presenting with head injury. The history is provided by the patient. No language interpreter was used.  Head Injury  The incident occurred 1 to 2 hours ago. She came to the ER via walk-in. The injury mechanism was a direct blow. There was no loss of consciousness. There was no blood loss. The quality of the pain is described as sharp. The pain is at a severity of 4/10. The pain is moderate. The pain has been constant since the injury. Pertinent negatives include no numbness, no blurred vision, no vomiting, no tinnitus, no disorientation, no weakness and no memory loss. She has tried nothing for the symptoms. The treatment provided no relief.    History reviewed. No pertinent past medical history.  History reviewed. No pertinent past surgical history.  No family history on file.  History  Substance Use Topics  . Smoking status: Current Some Day Smoker  . Smokeless tobacco: Not on file  . Alcohol Use: No    OB History    Grav Para Term Preterm Abortions TAB SAB Ect Mult Living                  Review of Systems  HENT: Negative for tinnitus.   Eyes: Negative for blurred vision.  Gastrointestinal: Negative for vomiting.  Neurological: Negative for weakness and numbness.  Psychiatric/Behavioral: Negative for memory loss.  All other systems reviewed and are negative.    Allergies  Review of patient's allergies indicates no known allergies.  Home Medications    No current outpatient prescriptions on file.  BP 145/97  Pulse 106  Temp(Src) 98.4 F (36.9 C) (Oral)  Resp 18  SpO2 99%  LMP 10/27/2011  Physical Exam  Nursing note and vitals reviewed. Constitutional: She is oriented to person, place, and time. She appears well-developed and well-nourished. No distress.  HENT:  Head: Normocephalic and atraumatic.  Right Ear: External ear normal.  Left Ear: External ear normal.  Nose: Nose normal.  Mouth/Throat: Oropharynx is clear and moist. No oropharyngeal exudate.  Eyes: Conjunctivae are normal. Pupils are equal, round, and reactive to light. No scleral icterus.  Neck: Normal range of motion. Neck supple.  Cardiovascular: Normal rate, regular rhythm and normal heart sounds.  Exam reveals no gallop and no friction rub.   No murmur heard. Pulmonary/Chest: Effort normal and breath sounds normal. No respiratory distress. She exhibits no tenderness.  Abdominal: Soft. Bowel sounds are normal. She exhibits no distension. There is no tenderness.  Musculoskeletal: Normal range of motion. She exhibits no edema and no tenderness.       Almost complete avulsion of left small fingernail with only lateral portion of the cuticle attached, nail bed intact  Lymphadenopathy:    She has no cervical adenopathy.  Neurological: She is alert and oriented to person, place, and time. No cranial nerve deficit.  Skin: Skin is warm and dry. No rash noted. No erythema. No pallor.  Psychiatric: She has a normal mood  and affect. Her behavior is normal. Judgment and thought content normal.    ED Course  NAIL REMOVAL Date/Time: 11/26/2011 3:16 AM Performed by: Marisue Humble, Hulan Szumski C. Authorized by: Patrecia Pour Consent: Verbal consent obtained. Risks and benefits: risks, benefits and alternatives were discussed Consent given by: patient Patient understanding: patient states understanding of the procedure being performed Patient consent: the patient's understanding  of the procedure does not match consent given Procedure consent: procedure consent does not match procedure scheduled Relevant documents: relevant documents not present or verified Test results: test results not available Site marked: the operative site was not marked Imaging studies: imaging studies not available Required items: required blood products, implants, devices, and special equipment available Patient identity confirmed: verbally with patient and arm band Time out: Immediately prior to procedure a "time out" was called to verify the correct patient, procedure, equipment, support staff and site/side marked as required. Location: left hand Location details: left small finger Anesthesia: digital block Local anesthetic: lidocaine 1% without epinephrine Anesthetic total: 3 ml Patient sedated: no Preparation: skin prepped with Betadine Amount removed: complete Wedge excision of skin of nail fold: no Nail bed sutured: no Nail matrix removed: complete Removed nail replaced and anchored: no Dressing: antibiotic ointment, tube gauze and dressing applied Patient tolerance: Patient tolerated the procedure well with no immediate complications.   (including critical care time)  Labs Reviewed - No data to display No results found.   Nail avulsion    MDM  Patient with initial partial avulsion of left small fingernail - unable to attach the remainder so we removed the entire nail - patient tolerated procedure well.        Izola Price [place], [place] 11/26/11 [phone]

## 2011-11-27 NOTE — ED Provider Notes (Signed)
Medical screening examination/treatment/procedure(s) were performed by non-physician practitioner and as supervising physician I was immediately available for consultation/collaboration.   Laray Anger, DO 11/27/11 [phone]

## 2012-12-31 ENCOUNTER — Inpatient Hospital Stay (HOSPITAL_COMMUNITY): Payer: Medicaid Other

## 2012-12-31 ENCOUNTER — Encounter (HOSPITAL_COMMUNITY): Payer: Self-pay

## 2012-12-31 ENCOUNTER — Inpatient Hospital Stay (HOSPITAL_COMMUNITY)
Admission: AD | Admit: 2012-12-31 | Discharge: 2012-12-31 | Disposition: A | Discharge status: Home / Self Care | Payer: Medicaid Other | Source: Ambulatory Visit | Attending: Obstetrics & Gynecology | Admitting: Obstetrics & Gynecology

## 2012-12-31 DIAGNOSIS — O34599 Maternal care for other abnormalities of gravid uterus, unspecified trimester: Secondary | ICD-10-CM | POA: Insufficient documentation

## 2012-12-31 DIAGNOSIS — R1031 Right lower quadrant pain: Secondary | ICD-10-CM | POA: Insufficient documentation

## 2012-12-31 DIAGNOSIS — N831 Corpus luteum cyst of ovary, unspecified side: Secondary | ICD-10-CM | POA: Insufficient documentation

## 2012-12-31 DIAGNOSIS — Z3490 Encounter for supervision of normal pregnancy, unspecified, unspecified trimester: Secondary | ICD-10-CM

## 2012-12-31 HISTORY — DX: Disorder of thyroid, unspecified: E07.9

## 2012-12-31 HISTORY — DX: Sickle-cell trait: D57.3

## 2012-12-31 HISTORY — DX: Obesity, unspecified: E66.9

## 2012-12-31 HISTORY — DX: Anemia, unspecified: D64.9

## 2012-12-31 LAB — URINALYSIS, ROUTINE W REFLEX MICROSCOPIC
Bilirubin Urine: NEGATIVE
Ketones, ur: NEGATIVE mg/dL
Nitrite: POSITIVE — AB
Protein, ur: NEGATIVE mg/dL
Urobilinogen, UA: 1 mg/dL (ref 0.0–1.0)
pH: 6 (ref 5.0–8.0)

## 2012-12-31 LAB — CBC
MCH: 23.4 pg — ABNORMAL LOW (ref 26.0–34.0)
MCHC: 34.4 g/dL (ref 30.0–36.0)
MCV: 67.8 fL — ABNORMAL LOW (ref 78.0–100.0)
Platelets: 374 10*3/uL (ref 150–400)
RBC: 4.41 MIL/uL (ref 3.87–5.11)
RDW: 16.6 % — ABNORMAL HIGH (ref 11.5–15.5)

## 2012-12-31 LAB — URINE MICROSCOPIC-ADD ON

## 2012-12-31 LAB — POCT PREGNANCY, URINE: Preg Test, Ur: POSITIVE — AB

## 2012-12-31 NOTE — MAU Note (Signed)
Patient is in with c/o rlq pain and cramping that started today after lifting an item that is heavy. She denies vaginal bleeding. lmp 11/22/12.

## 2012-12-31 NOTE — MAU Provider Note (Signed)
History     CSN: [id number]  Arrival date and time: 12/31/12 2104   None     Chief Complaint  Patient presents with  . Abdominal Pain   HPI This is a 32 y.o. female at [redacted]w[redacted]d who presents with c/o RLQ pain. Denies bleeding Worried about ectopic, states they run in her family . Aunt had two. Denies other problems  RN Note: Pt states she lifted a heavy bag at about 1445 yesterday and after napping she woke and felt some pulling on her right side. Pt states tubal pregnancy's run in her family      OB History   Grav Para Term Preterm Abortions TAB SAB Ect Mult Living   1               Past Medical History  Diagnosis Date  . Obese   . Thyroid disease   . Sickle cell trait   . Anemia     Past Surgical History  Procedure Laterality Date  . Knee surgery      Family History  Problem Relation Age of Onset  . Hypertension Mother   . Diabetes Mother   . Heart disease Mother   . Sickle cell trait Mother     History  Substance Use Topics  . Smoking status: Current Some Day Smoker  . Smokeless tobacco: Not on file  . Alcohol Use: No    Allergies: No Known Allergies  Prescriptions prior to admission  Medication Sig Dispense Refill  . diphenhydrAMINE (BENADRYL) 25 MG tablet Take 50 mg by mouth at bedtime.        Review of Systems  Constitutional: Negative for fever, chills and malaise/fatigue.  Gastrointestinal: Positive for abdominal pain. Negative for nausea, vomiting, diarrhea and constipation.  Genitourinary: Negative for dysuria.  Neurological: Negative for dizziness, weakness and headaches.   Physical Exam   Height 5' 3.5" (1.613 m), weight 264 lb 4 oz (119.863 kg), last menstrual period 11/22/2012.  Physical Exam  Constitutional: She is oriented to person, place, and time. She appears well-developed and well-nourished. No distress.  HENT:  Head: Normocephalic.  Cardiovascular: Normal rate.   Respiratory: Effort normal.  GI: Soft. She exhibits no  distension and no mass. There is tenderness (slightly tender RLQ). There is no rebound and no guarding.  Genitourinary: Vagina normal and uterus normal. No vaginal discharge found.  Musculoskeletal: Normal range of motion.  Neurological: She is alert and oriented to person, place, and time.  Skin: Skin is warm and dry.  Psychiatric: She has a normal mood and affect.    MAU Course  Procedures  MDM Results for orders placed during the hospital encounter of 12/31/12 (from the past 24 hour(s))  URINALYSIS, ROUTINE W REFLEX MICROSCOPIC     Status: Abnormal   Collection Time    12/31/12  9:15 PM      Result Value Range   Color, Urine YELLOW  YELLOW   APPearance CLEAR  CLEAR   Specific Gravity, Urine 1.025  1.005 - 1.030   pH 6.0  5.0 - 8.0   Glucose, UA NEGATIVE  NEGATIVE mg/dL   Hgb urine dipstick NEGATIVE  NEGATIVE   Bilirubin Urine NEGATIVE  NEGATIVE   Ketones, ur NEGATIVE  NEGATIVE mg/dL   Protein, ur NEGATIVE  NEGATIVE mg/dL   Urobilinogen, UA 1.0  0.0 - 1.0 mg/dL   Nitrite POSITIVE (*) NEGATIVE   Leukocytes, UA TRACE (*) NEGATIVE  URINE MICROSCOPIC-ADD ON     Status: Abnormal  Collection Time    12/31/12  9:15 PM      Result Value Range   Squamous Epithelial / LPF RARE  RARE   WBC, UA 3-6  <3 WBC/hpf   Bacteria, UA FEW (*) RARE  POCT PREGNANCY, URINE     Status: Abnormal   Collection Time    12/31/12  9:24 PM      Result Value Range   Preg Test, Ur POSITIVE (*) NEGATIVE  HCG, QUANTITATIVE, PREGNANCY     Status: Abnormal   Collection Time    12/31/12  9:45 PM      Result Value Range   hCG, Beta Chain, Quant, S 3668 (*) <5 mIU/mL  CBC     Status: Abnormal   Collection Time    12/31/12  9:45 PM      Result Value Range   WBC 11.8 (*) 4.0 - 10.5 K/uL   RBC 4.41  3.87 - 5.11 MIL/uL   Hemoglobin 10.3 (*) 12.0 - 15.0 g/dL   HCT 16.1 (*) 09.6 - 04.5 %   MCV 67.8 (*) 78.0 - 100.0 fL   MCH 23.4 (*) 26.0 - 34.0 pg   MCHC 34.4  30.0 - 36.0 g/dL   RDW 40.9 (*) 81.1 - 91.4  %   Platelets 374  150 - 400 K/uL  US Ob Transvaginal  12/31/2012  *RADIOLOGY REPORT*  Clinical Data: 32 year old female with right lower quadrant pain. Gestational age by LMP 5 weeks 4 days.  Quantitative beta HCG 3668.  OBSTETRIC <14 WK [place] AND TRANSVAGINAL OB US  Technique:  Both transabdominal and transvaginal ultrasound examinations were performed for complete evaluation of the gestation as well as the maternal uterus, adnexal regions, and pelvic cul-de-sac.  Transvaginal technique was performed to assess early pregnancy.  Comparison:  None.  Intrauterine gestational sac:  Single. Yolk sac: Visible (image 579). Embryo: Not visible Cardiac Activity: Not detected  MSD: 7.7 mm  5 w 3 d [place] EDC: 08/30/2013  Maternal uterus/adnexae: Moderate volume of subchorionic hemorrhage (image 69).  No pelvic free fluid.  Right ovary measures 3.1 x 2.5 x 2.9 cm and may contain a corpus luteum (image 576).  Normal left ovary only the visualized transabdominal.  2.0 x 2.3 x 1.5 cm.  IMPRESSION: Single intrauterine gestational sac with yolk sac.  Estimated gestational age [redacted] weeks and 3 days by mean sac diameter.  Moderate volume of subchorionic hemorrhage.  No pelvic free fluid and adnexa within normal limits.   Original Report Authenticated By: Erskine Speed, M.D.     Assessment and Plan  A:  SIUP at [redacted]w[redacted]d       Yolk sac visible      Embryo not visible yet      [facility]      Right corpus luteum cyst  P:  DIscharge home       Proceed to prenatal care       Discussed RLQ pain is probably related to cyst      Return PRN  [facility] 12/31/2012, 11:16 PM

## 2012-12-31 NOTE — MAU Note (Signed)
Pt states she lifted a heavy bag at about 1445 yesterday and after napping she woke and felt some pulling on her right side. Pt states tubal pregnancy's run in her family

## 2013-01-01 ENCOUNTER — Encounter (HOSPITAL_COMMUNITY): Payer: Self-pay | Admitting: Advanced Practice Midwife

## 2013-01-02 LAB — URINE CULTURE: Colony Count: >=100000

## 2013-01-03 ENCOUNTER — Other Ambulatory Visit: Payer: Self-pay | Admitting: Obstetrics & Gynecology

## 2013-01-03 DIAGNOSIS — O2341 Unspecified infection of urinary tract in pregnancy, first trimester: Secondary | ICD-10-CM

## 2013-01-03 MED ORDER — CEPHALEXIN 500 MG PO CAPS: 500.0000 mg | ORAL_CAPSULE | Freq: Four times a day (QID) | ORAL | Status: DC

## 2013-01-03 NOTE — Progress Notes (Signed)
Urine culture showed >100K E.coli.  Keflex prescribed.  Attempted to call patient, got her voicemail.  She will be called and told to pick up Rx.

## 2013-01-03 NOTE — MAU Provider Note (Signed)
Attestation of Attending Supervision of Advanced Practitioner (PA/CNM/NP): Evaluation and management procedures were performed by the Advanced Practitioner under my supervision and collaboration.  I have reviewed the Advanced Practitioner's note and chart, and I agree with the management and plan.  Camiah Humm, MD, FACOG Attending Obstetrician & Gynecologist Faculty Practice, [facility]  

## 2013-01-06 NOTE — Progress Notes (Signed)
Called patient and notified of result and Rx sent to pharm. Patient agrees and satisfied.

## 2013-02-13 ENCOUNTER — Encounter (HOSPITAL_COMMUNITY): Payer: Self-pay | Admitting: *Deleted

## 2013-02-13 ENCOUNTER — Inpatient Hospital Stay (HOSPITAL_COMMUNITY)
Admission: AD | Admit: 2013-02-13 | Discharge: 2013-02-13 | Disposition: A | Discharge status: Home / Self Care | Payer: Medicaid Other | Source: Ambulatory Visit | Attending: Obstetrics and Gynecology | Admitting: Obstetrics and Gynecology

## 2013-02-13 DIAGNOSIS — O210 Mild hyperemesis gravidarum: Secondary | ICD-10-CM

## 2013-02-13 DIAGNOSIS — O219 Vomiting of pregnancy, unspecified: Secondary | ICD-10-CM

## 2013-02-13 LAB — URINALYSIS, ROUTINE W REFLEX MICROSCOPIC
Glucose, UA: NEGATIVE mg/dL
Ketones, ur: NEGATIVE mg/dL
Leukocytes, UA: NEGATIVE
Protein, ur: NEGATIVE mg/dL
Urobilinogen, UA: 1 mg/dL (ref 0.0–1.0)

## 2013-02-13 LAB — URINE MICROSCOPIC-ADD ON

## 2013-02-13 MED ORDER — PROMETHAZINE HCL 25 MG PO TABS: 25.0000 mg | ORAL_TABLET | Freq: Four times a day (QID) | ORAL | Status: DC | PRN

## 2013-02-13 MED ORDER — ONDANSETRON HCL 4 MG/2ML IJ SOLN
4.0000 mg | Freq: Once | INTRAMUSCULAR | Status: AC
  Administered 2013-02-13: 4 mg via INTRAVENOUS
  Filled 2013-02-13: qty 2

## 2013-02-13 MED ORDER — ONDANSETRON 8 MG PO TBDP: 8.0000 mg | ORAL_TABLET | Freq: Three times a day (TID) | ORAL | Status: DC | PRN

## 2013-02-13 MED ORDER — DEXTROSE IN LACTATED RINGERS 5 % IV SOLN
Freq: Once | INTRAVENOUS | Status: AC
  Administered 2013-02-13: 19:00:00 via INTRAVENOUS
  Filled 2013-02-13: qty 1000

## 2013-02-13 MED ORDER — DOXYLAMINE-PYRIDOXINE 10-10 MG PO TBEC: 2.0000 | DELAYED_RELEASE_TABLET | Freq: Every day | ORAL | Status: DC

## 2013-02-13 MED ORDER — FAMOTIDINE IN NACL 20-0.9 MG/50ML-% IV SOLN
20.0000 mg | Freq: Once | INTRAVENOUS | Status: AC
  Administered 2013-02-13: 20 mg via INTRAVENOUS
  Filled 2013-02-13: qty 50

## 2013-02-13 NOTE — MAU Provider Note (Signed)
History     CSN: [id number]  Arrival date and time: 02/13/13 1802   None     Chief Complaint  Patient presents with  . Emesis During Pregnancy   HPI Pt isG5P3104 @ [redacted]w[redacted]d pregnant and presents with nausea and vomiting in pregnancy.  Pt has been vomiting for 2 weeks and has worsened to the point of not keeping anything down for 2 days.  Pt has been vomiting mucous prior to visit.  Pt denies abd pain, cramping, bleeding or UTI symptoms.  Pt has an appointment February 19, 2013 with  Dr. Clearance Coots for new pt/new OB appt.     Past Medical History  Diagnosis Date  . Obese   . Thyroid disease   . Sickle cell trait   . Anemia     Past Surgical History  Procedure Laterality Date  . Knee surgery      Family History  Problem Relation Age of Onset  . Hypertension Mother   . Diabetes Mother   . Heart disease Mother   . Sickle cell trait Mother     History  Substance Use Topics  . Smoking status: Current Some Day Smoker  . Smokeless tobacco: Not on file  . Alcohol Use: No    Allergies: No Known Allergies  Prescriptions prior to admission  Medication Sig Dispense Refill  . cephALEXin (KEFLEX) 500 MG capsule Take 1 capsule (500 mg total) by mouth 4 (four) times daily.  28 capsule  2  . diphenhydrAMINE (BENADRYL) 25 MG tablet Take 50 mg by mouth at bedtime.        ROS Physical Exam   Blood pressure 140/77, pulse 88, temperature 98 F (36.7 C), temperature source Oral, resp. rate 17, height 5\' 3"  (1.6 m), weight 116.121 kg (256 lb), last menstrual period 11/22/2012, SpO2 100.00%.  Physical Exam  Vitals reviewed. Constitutional: She is oriented to person, place, and time. She appears well-developed and well-nourished. No distress.  Eyes: Pupils are equal, round, and reactive to light.  Neck: Normal range of motion. Neck supple.  Cardiovascular: Normal rate.   Respiratory: Effort normal.  GI: Soft.  Musculoskeletal: Normal range of motion.  Neurological: She is alert and  oriented to person, place, and time.  Skin: Skin is warm and dry.  Psychiatric: She has a normal mood and affect.    MAU Course  Procedures Results for orders placed during the hospital encounter of 02/13/13 (from the past 24 hour(s))  URINALYSIS, ROUTINE W REFLEX MICROSCOPIC     Status: Abnormal   Collection Time    02/13/13  6:15 PM      Result Value Range   Color, Urine YELLOW  YELLOW   APPearance CLEAR  CLEAR   Specific Gravity, Urine 1.015  1.005 - 1.030   pH 6.5  5.0 - 8.0   Glucose, UA NEGATIVE  NEGATIVE mg/dL   Hgb urine dipstick TRACE (*) NEGATIVE   Bilirubin Urine NEGATIVE  NEGATIVE   Ketones, ur NEGATIVE  NEGATIVE mg/dL   Protein, ur NEGATIVE  NEGATIVE mg/dL   Urobilinogen, UA 1.0  0.0 - 1.0 mg/dL   Nitrite NEGATIVE  NEGATIVE   Leukocytes, UA NEGATIVE  NEGATIVE  URINE MICROSCOPIC-ADD ON     Status: Abnormal   Collection Time    02/13/13  6:15 PM      Result Value Range   Squamous Epithelial / LPF MANY (*) RARE   WBC, UA 3-6  <3 WBC/hpf   RBC / HPF 0-2  <3 RBC/hpf  Bacteria, UA RARE  RARE   IVF D5LR with Phenergan 25mg  IV and Pepcid 20mg  IV given- pt has not had any vomiting since in MAU Will give Zofran 4mg  IVP prior to d/c IV Will give prescription for Diclegis; zofran and phenergan for back up   Assessment and Plan  Nausea and vomiting in pregnancy Prescription for Diclegis- call Dr. Verdell Carmine office and ask for samples and coupon Zofran 8mg  ODT and phenergan 25mg  tablets prescription F/u for New OB appointment with Dr. Clearance Coots as scheduled    [facility] 02/13/2013, 6:23 PM

## 2013-02-13 NOTE — MAU Note (Signed)
Patient states she has had nausea and vomiting for about one month. Has been worse for the past 2 weeks and is now not able to keep anything down. Denies bleeding or pain.

## 2013-02-17 NOTE — MAU Provider Note (Signed)
Attestation of Attending Supervision of Advanced Practitioner (CNM/NP): Evaluation and management procedures were performed by the Advanced Practitioner under my supervision and collaboration.  I have reviewed the Advanced Practitioner's note and chart, and I agree with the management and plan.  Lorelie Biermann 02/17/2013 11:50 AM

## 2013-02-19 ENCOUNTER — Encounter: Payer: Self-pay | Admitting: Obstetrics

## 2013-02-19 ENCOUNTER — Ambulatory Visit (INDEPENDENT_AMBULATORY_CARE_PROVIDER_SITE_OTHER): Payer: Medicaid Other | Admitting: Obstetrics

## 2013-02-19 VITALS — BP 115/65 | Temp 98.2°F | Wt 256.0 lb

## 2013-02-19 DIAGNOSIS — N760 Acute vaginitis: Secondary | ICD-10-CM

## 2013-02-19 DIAGNOSIS — Z113 Encounter for screening for infections with a predominantly sexual mode of transmission: Secondary | ICD-10-CM

## 2013-02-19 DIAGNOSIS — Z3481 Encounter for supervision of other normal pregnancy, first trimester: Secondary | ICD-10-CM

## 2013-02-19 DIAGNOSIS — O210 Mild hyperemesis gravidarum: Secondary | ICD-10-CM

## 2013-02-19 DIAGNOSIS — Z3480 Encounter for supervision of other normal pregnancy, unspecified trimester: Secondary | ICD-10-CM

## 2013-02-19 DIAGNOSIS — O219 Vomiting of pregnancy, unspecified: Secondary | ICD-10-CM

## 2013-02-19 LAB — OB RESULTS CONSOLE GC/CHLAMYDIA: Gonorrhea: NEGATIVE

## 2013-02-19 MED ORDER — ONDANSETRON 8 MG PO TBDP: 8.0000 mg | ORAL_TABLET | Freq: Three times a day (TID) | ORAL | Status: DC | PRN

## 2013-02-19 MED ORDER — OB COMPLETE PETITE 35-5-1-200 MG PO CAPS: 1.0000 | ORAL_CAPSULE | Freq: Every day | ORAL | Status: DC

## 2013-02-19 NOTE — Addendum Note (Signed)
Addended by: Julaine Hua on: 02/19/2013 03:59 PM   Modules accepted: Orders

## 2013-02-19 NOTE — Progress Notes (Signed)
Pt states she only has pressure when she is vomiting.   Subjective:    Megan [name] is being seen today for her first obstetrical visit.  This is not a planned pregnancy. She is at [redacted]w[redacted]d gestation. Her obstetrical history is significant for obesity and preterm delivery. Relationship with FOB: significant other, living together. Patient does intend to breast feed. Pregnancy history fully reviewed.  Menstrual History: OB History   Grav Para Term Preterm Abortions TAB SAB Ect Mult Living   5 4 3 1      4       Last pap: 2013 Normal  Menarche age: 74 Irregular Patient's last menstrual period was 11/22/2012.    The following portions of the patient's history were reviewed and updated as appropriate: allergies, current medications, past family history, past medical history, past social history, past surgical history and problem list.  Review of Systems Pertinent items are noted in HPI.    Objective:    General appearance: alert and no distress Abdomen: normal findings: soft, non-tender Pelvic: cervix normal in appearance, external genitalia normal, no adnexal masses or tenderness, no cervical motion tenderness, vagina normal without discharge and uterus enlarged, NT, soft.    Assessment:    Pregnancy at [redacted]w[redacted]d weeks    Plan:    Initial labs drawn. Prenatal vitamins. Problem list reviewed and updated. AFP3 discussed: requested. Role of ultrasound in pregnancy discussed; fetal survey: requested. Amniocentesis discussed: not indicated. Follow up in 4 weeks. 50% of 20 min visit spent on counseling and coordination of care.

## 2013-02-20 LAB — GC/CHLAMYDIA PROBE AMP: GC Probe RNA: NEGATIVE

## 2013-02-20 LAB — OBSTETRIC PANEL
Antibody Screen: NEGATIVE
Eosinophils Relative: 2 % (ref 0–5)
HCT: 33.5 % — ABNORMAL LOW (ref 36.0–46.0)
Hemoglobin: 11.5 g/dL — ABNORMAL LOW (ref 12.0–15.0)
Lymphocytes Relative: 27 % (ref 12–46)
MCV: 71.1 fL — ABNORMAL LOW (ref 78.0–100.0)
Monocytes Absolute: 0.6 10*3/uL (ref 0.1–1.0)
Monocytes Relative: 6 % (ref 3–12)
Neutro Abs: 7.1 10*3/uL (ref 1.7–7.7)
Rh Type: POSITIVE
Rubella: 1.08 Index — ABNORMAL HIGH (ref <0.90)
WBC: 10.9 10*3/uL — ABNORMAL HIGH (ref 4.0–10.5)

## 2013-02-20 LAB — WET PREP BY MOLECULAR PROBE
Candida species: NEGATIVE
Gardnerella vaginalis: POSITIVE — AB

## 2013-02-20 LAB — VARICELLA ZOSTER ANTIBODY, IGG: Varicella IgG: 720.2 Index — ABNORMAL HIGH (ref <135.00)

## 2013-02-21 LAB — CULTURE, OB URINE: Colony Count: >=100000

## 2013-02-24 LAB — HEMOGLOBINOPATHY EVALUATION
Hemoglobin Other: 0 %
Hgb A2 Quant: 3.2 % (ref 2.2–3.2)
Hgb A: 62.7 % — ABNORMAL LOW (ref 96.8–97.8)
Hgb S Quant: 34.1 % — ABNORMAL HIGH

## 2013-03-03 ENCOUNTER — Encounter (HOSPITAL_COMMUNITY): Payer: Self-pay | Admitting: *Deleted

## 2013-03-03 ENCOUNTER — Inpatient Hospital Stay (HOSPITAL_COMMUNITY)
Admission: AD | Admit: 2013-03-03 | Discharge: 2013-03-03 | Disposition: A | Discharge status: Home / Self Care | Payer: Medicaid Other | Source: Ambulatory Visit | Attending: Obstetrics & Gynecology | Admitting: Obstetrics & Gynecology

## 2013-03-03 DIAGNOSIS — N390 Urinary tract infection, site not specified: Secondary | ICD-10-CM | POA: Insufficient documentation

## 2013-03-03 DIAGNOSIS — O219 Vomiting of pregnancy, unspecified: Secondary | ICD-10-CM

## 2013-03-03 DIAGNOSIS — K5900 Constipation, unspecified: Secondary | ICD-10-CM | POA: Insufficient documentation

## 2013-03-03 DIAGNOSIS — R42 Dizziness and giddiness: Secondary | ICD-10-CM | POA: Insufficient documentation

## 2013-03-03 DIAGNOSIS — O2342 Unspecified infection of urinary tract in pregnancy, second trimester: Secondary | ICD-10-CM

## 2013-03-03 DIAGNOSIS — O2390 Unspecified genitourinary tract infection in pregnancy, unspecified trimester: Secondary | ICD-10-CM | POA: Insufficient documentation

## 2013-03-03 DIAGNOSIS — O210 Mild hyperemesis gravidarum: Secondary | ICD-10-CM | POA: Insufficient documentation

## 2013-03-03 LAB — URINALYSIS, ROUTINE W REFLEX MICROSCOPIC
Ketones, ur: NEGATIVE mg/dL
Nitrite: NEGATIVE
Protein, ur: 30 mg/dL — AB
Urobilinogen, UA: 1 mg/dL (ref 0.0–1.0)

## 2013-03-03 LAB — URINE MICROSCOPIC-ADD ON

## 2013-03-03 MED ORDER — DOXYLAMINE-PYRIDOXINE 10-10 MG PO TBEC: 2.0000 | DELAYED_RELEASE_TABLET | Freq: Every day | ORAL | Status: DC

## 2013-03-03 MED ORDER — PROMETHAZINE HCL 25 MG PO TABS: 25.0000 mg | ORAL_TABLET | Freq: Four times a day (QID) | ORAL | Status: DC | PRN

## 2013-03-03 MED ORDER — PROMETHAZINE HCL 25 MG PO TABS
25.0000 mg | ORAL_TABLET | Freq: Once | ORAL | Status: AC
  Administered 2013-03-03: 25 mg via ORAL
  Filled 2013-03-03: qty 1

## 2013-03-03 MED ORDER — CEFUROXIME AXETIL 500 MG PO TABS: 500.0000 mg | ORAL_TABLET | Freq: Two times a day (BID) | ORAL | Status: AC

## 2013-03-03 NOTE — MAU Provider Note (Signed)
Chief Complaint: Emesis During Pregnancy and Dizziness   First Provider Initiated Contact with Patient 03/03/13 2244     SUBJECTIVE HPI: Megan [name] is a 32 y.o. O1H0865 at [redacted]w[redacted]d by LMP who presents with vomiting 3 times today, light headedness and generalized abd soreness that she attributes to gagging. Has Rx's for Diclegis (states she cannot afford), Zofran and phenergan. States she was told by her Ob that she should not need meds for N/V of pregnancy due to entering second trimester and did not take anything for her Sx. Has taken Zofran in past, but made her feel sicker. Also C/O constipation.   Screening urine culture from 02/19/13 office visit pos UTI. Pt states she did not get Rx.    Past Medical History  Diagnosis Date  . Obese   . Thyroid disease   . Sickle cell trait   . Anemia    OB History   Grav Para Term Preterm Abortions TAB SAB Ect Mult Living   5 4 3 1      4      # Outc Date GA Lbr Len/2nd Wgt Sex Del Anes PTL Lv   1 PRE 7/05    F SVD  Yes Yes   2 TRM 2/07    M SVD   Yes   3 TRM 2/08    M SVD   Yes   4 TRM 3/12    M SVD   Yes   5 CUR              Past Surgical History  Procedure Laterality Date  . Knee surgery     History   Social History  . Marital Status: Single    Spouse Name: N/A    Number of Children: N/A  . Years of Education: N/A   Occupational History  . Not on file.   Social History Main Topics  . Smoking status: Former Smoker    Types: Cigarettes    Quit date: 01/01/2013  . Smokeless tobacco: Not on file  . Alcohol Use: No  . Drug Use: No  . Sexually Active: Yes    Birth Control/ Protection: None   Other Topics Concern  . Not on file   Social History Narrative  . No narrative on file   No current facility-administered medications on file prior to encounter.   Current Outpatient Prescriptions on File Prior to Encounter  Medication Sig Dispense Refill  . Prenat-FeCbn-FeAspGl-FA-Omega (OB COMPLETE PETITE) 35-5-1-200 MG  CAPS Take 1 capsule by mouth daily before breakfast.  90 capsule  3   No Known Allergies  ROS: Pertinent items in HPI  OBJECTIVE Blood pressure 128/71, pulse 94, temperature 98.3 F (36.8 C), temperature source Oral, resp. rate 16, height 5\' 3"  (1.6 m), weight 115.758 kg (255 lb 3.2 oz), last menstrual period 11/22/2012. GENERAL: Well-developed, well-nourished female in no acute distress.  HEENT: Normocephalic HEART: normal rate RESP: normal effort ABDOMEN: Soft, non-tender. No CVAT. EXTREMITIES: Nontender, no edema NEURO: Alert and oriented SPECULUM EXAM: Deferred  FHT's 159 per doppler.  LAB RESULTS Results for orders placed during the hospital encounter of 03/03/13 (from the past 24 hour(s))  URINALYSIS, ROUTINE W REFLEX MICROSCOPIC     Status: Abnormal   Collection Time    03/03/13  9:45 PM      Result Value Range   Color, Urine YELLOW  YELLOW   APPearance HAZY (*) CLEAR   Specific Gravity, Urine 1.020  1.005 - 1.030   pH 6.0  5.0 - 8.0   Glucose, UA NEGATIVE  NEGATIVE mg/dL   Hgb urine dipstick TRACE (*) NEGATIVE   Bilirubin Urine NEGATIVE  NEGATIVE   Ketones, ur NEGATIVE  NEGATIVE mg/dL   Protein, ur 30 (*) NEGATIVE mg/dL   Urobilinogen, UA 1.0  0.0 - 1.0 mg/dL   Nitrite NEGATIVE  NEGATIVE   Leukocytes, UA TRACE (*) NEGATIVE  URINE MICROSCOPIC-ADD ON     Status: Abnormal   Collection Time    03/03/13  9:45 PM      Result Value Range   Squamous Epithelial / LPF FEW (*) RARE   WBC, UA 7-10  <3 WBC/hpf   RBC / HPF 3-6  <3 RBC/hpf   Bacteria, UA FEW (*) RARE  GLUCOSE, CAPILLARY     Status: Abnormal   Collection Time    03/03/13 11:05 PM      Result Value Range   Glucose-Capillary 104 (*) 70 - 99 mg/dL    IMAGING No results found.  MAU COURSE Nausea resolved w/ Phenergan. Tolerating PO. Dizziness better.   ASSESSMENT 1. Nausea and vomiting in pregnancy prior to [redacted] weeks gestation   2. UTI in pregnancy, antepartum, second trimester   3. Light headedness      PLAN Discharge home in stable condition. Increase fluids and fiber. Pt instructed to get Diclegis prior authorization form from Dr. Verdell Carmine office. Use antiemetics if you become symptomatic from N/V (weakness, dizziness).     Follow-up Information   Follow up with HARPER,CHARLES A, MD On 03/20/2013.   Contact information:   [address] [place] [address] [phone]       Follow up with [facility] [place] [facility]ERN[facility] ADMISS[facility]. (As needed if symptoms worsen)    Contact information:   [address] [id number] [place] [address] [phone]    Per Dr. Verdell Carmine result note, will Rx Ceftin for UTI.    Medication List    STOP taking these medications       ondansetron 8 MG disintegrating tablet  Commonly known as:  ZOFRAN ODT      TAKE these medications       cefUROXime 500 MG tablet  Commonly known as:  CEFTIN  Take 1 tablet (500 mg total) by mouth 2 (two) times daily.     Doxylamine-Pyridoxine 10-10 MG Tbec  Commonly known as:  DICLEGIS  - Take 2 tablets by mouth at bedtime. If symptoms persist, add 1 tab every morning starting Day 3  - If symptoms persist, add 1 tab every evening starting Day 4     OB COMPLETE PETITE 35-5-1-200 MG Caps  Take 1 capsule by mouth daily before breakfast.     promethazine 25 MG tablet  Commonly known as:  PHENERGAN  Take 1 tablet (25 mg total) by mouth every 6 (six) hours as needed for nausea.       [place], CNM 03/03/2013  11:16 PM

## 2013-03-03 NOTE — MAU Note (Signed)
Pt G5 P4 at 14.1wks vomiting x 3 today, feeling light headed.

## 2013-03-05 LAB — URINE CULTURE

## 2013-03-20 ENCOUNTER — Encounter: Payer: Medicaid Other | Admitting: Obstetrics

## 2013-03-27 ENCOUNTER — Encounter: Payer: Self-pay | Admitting: Obstetrics

## 2013-03-27 ENCOUNTER — Ambulatory Visit (INDEPENDENT_AMBULATORY_CARE_PROVIDER_SITE_OTHER): Payer: Medicaid Other | Admitting: Obstetrics

## 2013-03-27 VITALS — BP 128/72 | Temp 98.3°F | Wt 254.0 lb

## 2013-03-27 DIAGNOSIS — Z3480 Encounter for supervision of other normal pregnancy, unspecified trimester: Secondary | ICD-10-CM

## 2013-03-27 DIAGNOSIS — Z3482 Encounter for supervision of other normal pregnancy, second trimester: Secondary | ICD-10-CM

## 2013-03-27 DIAGNOSIS — Z369 Encounter for antenatal screening, unspecified: Secondary | ICD-10-CM

## 2013-03-27 LAB — POCT URINALYSIS DIPSTICK: Ketones, UA: NEGATIVE

## 2013-03-27 NOTE — Progress Notes (Signed)
Pulse-112 Pt c/o thick vaginal discharge with mild itching x 3 weeks. Pt has history of yeast infections with pregnancy.

## 2013-03-31 LAB — AFP, QUAD SCREEN
AFP: 15.4 IU/mL
Curr Gest Age: 17.4 wks.days
Interpretation-AFP: POSITIVE — AB
Osb Risk: <1:54600 {titer}
uE3 Mom: 0.76
uE3 Value: 0.5 ng/mL

## 2013-04-02 NOTE — Progress Notes (Signed)
Dating by early ultrasound is correct. Ultrasound was performed on 01-10-13 and verified due date.

## 2013-04-15 ENCOUNTER — Other Ambulatory Visit: Payer: Medicaid Other

## 2013-04-15 ENCOUNTER — Encounter (HOSPITAL_COMMUNITY): Payer: Self-pay | Admitting: Obstetrics

## 2013-04-22 ENCOUNTER — Other Ambulatory Visit: Payer: Self-pay | Admitting: Obstetrics

## 2013-04-22 DIAGNOSIS — O280 Abnormal hematological finding on antenatal screening of mother: Secondary | ICD-10-CM

## 2013-04-23 ENCOUNTER — Ambulatory Visit (INDEPENDENT_AMBULATORY_CARE_PROVIDER_SITE_OTHER): Payer: Medicaid Other | Admitting: Obstetrics

## 2013-04-23 ENCOUNTER — Encounter: Payer: Self-pay | Admitting: Obstetrics

## 2013-04-23 VITALS — BP 134/75 | Temp 98.3°F | Wt 253.0 lb

## 2013-04-23 DIAGNOSIS — Z3480 Encounter for supervision of other normal pregnancy, unspecified trimester: Secondary | ICD-10-CM

## 2013-04-23 DIAGNOSIS — Z3482 Encounter for supervision of other normal pregnancy, second trimester: Secondary | ICD-10-CM

## 2013-04-23 DIAGNOSIS — N390 Urinary tract infection, site not specified: Secondary | ICD-10-CM

## 2013-04-23 MED ORDER — NITROFURANTOIN MONOHYD MACRO 100 MG PO CAPS: 100.0000 mg | ORAL_CAPSULE | Freq: Two times a day (BID) | ORAL | Status: DC

## 2013-04-23 NOTE — Progress Notes (Signed)
Pulse- 101 

## 2013-04-24 ENCOUNTER — Ambulatory Visit (HOSPITAL_COMMUNITY): Payer: Medicaid Other

## 2013-04-24 LAB — POCT URINALYSIS DIPSTICK
Bilirubin, UA: 1
Glucose, UA: NEGATIVE
Ketones, UA: 3
Spec Grav, UA: <=1.005

## 2013-04-25 LAB — URINE CULTURE: Colony Count: >=100000

## 2013-04-29 ENCOUNTER — Other Ambulatory Visit: Payer: Medicaid Other

## 2013-04-29 ENCOUNTER — Encounter (HOSPITAL_COMMUNITY): Payer: Self-pay | Admitting: *Deleted

## 2013-04-29 ENCOUNTER — Inpatient Hospital Stay (HOSPITAL_COMMUNITY)
Admission: AD | Admit: 2013-04-29 | Discharge: 2013-04-29 | Disposition: A | Discharge status: Home / Self Care | Payer: Medicaid Other | Source: Ambulatory Visit | Attending: Obstetrics | Admitting: Obstetrics

## 2013-04-29 DIAGNOSIS — O210 Mild hyperemesis gravidarum: Secondary | ICD-10-CM

## 2013-04-29 DIAGNOSIS — O219 Vomiting of pregnancy, unspecified: Secondary | ICD-10-CM

## 2013-04-29 DIAGNOSIS — R109 Unspecified abdominal pain: Secondary | ICD-10-CM | POA: Insufficient documentation

## 2013-04-29 DIAGNOSIS — O2390 Unspecified genitourinary tract infection in pregnancy, unspecified trimester: Secondary | ICD-10-CM | POA: Insufficient documentation

## 2013-04-29 DIAGNOSIS — O212 Late vomiting of pregnancy: Secondary | ICD-10-CM | POA: Insufficient documentation

## 2013-04-29 DIAGNOSIS — N390 Urinary tract infection, site not specified: Secondary | ICD-10-CM | POA: Insufficient documentation

## 2013-04-29 DIAGNOSIS — E876 Hypokalemia: Secondary | ICD-10-CM | POA: Insufficient documentation

## 2013-04-29 LAB — COMPREHENSIVE METABOLIC PANEL
ALT: 7 U/L (ref 0–35)
Alkaline Phosphatase: 52 U/L (ref 39–117)
CO2: 20 mEq/L (ref 19–32)
GFR calc Af Amer: >90 mL/min (ref >90)
GFR calc non Af Amer: >90 mL/min (ref >90)
Glucose, Bld: 96 mg/dL (ref 70–99)
Potassium: 2.9 mEq/L — ABNORMAL LOW (ref 3.5–5.1)
Sodium: 139 mEq/L (ref 135–145)
Total Protein: 6.7 g/dL (ref 6.0–8.3)

## 2013-04-29 LAB — CBC
Hemoglobin: 10.2 g/dL — ABNORMAL LOW (ref 12.0–15.0)
RBC: 4.12 MIL/uL (ref 3.87–5.11)

## 2013-04-29 LAB — URINALYSIS, ROUTINE W REFLEX MICROSCOPIC
Bilirubin Urine: NEGATIVE
Ketones, ur: 40 mg/dL — AB
Nitrite: NEGATIVE
pH: 7 (ref 5.0–8.0)

## 2013-04-29 LAB — URINE MICROSCOPIC-ADD ON

## 2013-04-29 MED ORDER — ONDANSETRON HCL 4 MG/2ML IJ SOLN
4.0000 mg | Freq: Once | INTRAMUSCULAR | Status: AC
  Administered 2013-04-29: 4 mg via INTRAVENOUS
  Filled 2013-04-29: qty 2

## 2013-04-29 MED ORDER — POTASSIUM CHLORIDE CRYS ER 20 MEQ PO TBCR: 20.0000 meq | EXTENDED_RELEASE_TABLET | Freq: Two times a day (BID) | ORAL | Status: DC

## 2013-04-29 MED ORDER — PROMETHAZINE HCL 12.5 MG PO TABS: 12.5000 mg | ORAL_TABLET | Freq: Four times a day (QID) | ORAL | Status: DC | PRN

## 2013-04-29 MED ORDER — KEFLEX 500 MG PO CAPS: 500.0000 mg | ORAL_CAPSULE | Freq: Three times a day (TID) | ORAL | Status: DC

## 2013-04-29 MED ORDER — SODIUM CHLORIDE 0.9 % IV BOLUS (SEPSIS)
1000.0000 mL | Freq: Once | INTRAVENOUS | Status: AC
  Administered 2013-04-29: 1000 mL via INTRAVENOUS

## 2013-04-29 NOTE — MAU Note (Addendum)
syncope episode at  Home today, @ 1130, jumped up from toilet when 33 year old cam in , heard knocking on door and able to get up to answer door, 1 y/o at  Home with her at the time, friend has children, headache started since the episode N and V with last pregnancy had to be IP,  Emesis since 0530 consistently, even with water and crackers Dizzy today . Less than earlier today BP 131/67 Started with N/V in evening  on Monday after cookout and swimming

## 2013-04-29 NOTE — MAU Provider Note (Signed)
History     CSN: [id number]  Arrival date and time: 04/29/13 1233   First Provider Initiated Contact with Patient 04/29/13 1335      Chief Complaint  Patient presents with  . Emesis  . Abdominal Pain  . Headache   HPI  Pt is a G5P3104 at [redacted]w[redacted]d weeks here with nausea and vomiting.  Reports vomiting "all night".  Vomiting started at approximately 1300 yesterday.  Unable to hold down any food or drink.  + diarrhea, with multiple episodes per patient.  Reports feeling chills.  Uncertain if fever, has not taken at home.  No report of vaginal bleeding or contractions.    Past Medical History  Diagnosis Date  . Obese   . Thyroid disease   . Sickle cell trait   . Anemia     Past Surgical History  Procedure Laterality Date  . Knee surgery    . Finger nail surgery    . Wisdom tooth extraction      Family History  Problem Relation Age of Onset  . Hypertension Mother   . Diabetes Mother   . Heart disease Mother   . Sickle cell trait Mother     History  Substance Use Topics  . Smoking status: Former Smoker    Types: Cigarettes    Quit date: 01/01/2013  . Smokeless tobacco: Not on file  . Alcohol Use: No    Allergies: No Known Allergies  Prescriptions prior to admission  Medication Sig Dispense Refill  . nitrofurantoin, macrocrystal-monohydrate, (MACROBID) 100 MG capsule Take 1 capsule (100 mg total) by mouth 2 (two) times daily.  14 capsule  0  . ondansetron (ZOFRAN) 4 MG tablet Take 4 mg by mouth every 8 (eight) hours as needed for nausea.      . Prenat-FeCbn-FeAspGl-FA-Omega (OB COMPLETE PETITE) 35-5-1-200 MG CAPS Take 1 capsule by mouth daily before breakfast.  90 capsule  3    Review of Systems  Constitutional: Positive for chills. Negative for fever.  HENT: Negative for sore throat.   Gastrointestinal: Positive for nausea, vomiting and diarrhea. Negative for abdominal pain and constipation.  Neurological: Positive for dizziness.   Physical Exam   Blood  pressure 131/67, pulse 111, temperature 98.2 F (36.8 C), resp. rate 18, height 5\' 3"  (1.6 m), weight 110.678 kg (244 lb), last menstrual period 11/22/2012, SpO2 100.00%.  Physical Exam  Constitutional: She is oriented to person, place, and time. She appears well-developed and well-nourished. No distress.  Appears ill appearing  HENT:  Head: Normocephalic.  Mouth/Throat: Mucous membranes are normal. Mucous membranes are not dry.  Neck: Normal range of motion. Neck supple.  Cardiovascular: Normal rate, regular rhythm and normal heart sounds.   Respiratory: Effort normal and breath sounds normal.  GI: Soft. There is no tenderness.  Hyperactive bowel sounds  Genitourinary: No bleeding around the vagina.  Neurological: She is alert and oriented to person, place, and time.  Skin: Skin is warm and dry.    MAU Course  Procedures Results for orders placed during the hospital encounter of 04/29/13 (from the past 24 hour(s))  URINALYSIS, ROUTINE W REFLEX MICROSCOPIC     Status: Abnormal   Collection Time    04/29/13  1:10 PM      Result Value Range   Color, Urine YELLOW  YELLOW   APPearance HAZY (*) CLEAR   Specific Gravity, Urine 1.020  1.005 - 1.030   pH 7.0  5.0 - 8.0   Glucose, UA NEGATIVE  NEGATIVE mg/dL  Hgb urine dipstick TRACE (*) NEGATIVE   Bilirubin Urine NEGATIVE  NEGATIVE   Ketones, ur 40 (*) NEGATIVE mg/dL   Protein, ur 30 (*) NEGATIVE mg/dL   Urobilinogen, UA 2.0 (*) 0.0 - 1.0 mg/dL   Nitrite NEGATIVE  NEGATIVE   Leukocytes, UA MODERATE (*) NEGATIVE  URINE MICROSCOPIC-ADD ON     Status: Abnormal   Collection Time    04/29/13  1:10 PM      Result Value Range   Squamous Epithelial / LPF FEW (*) RARE   WBC, UA 21-50  <3 WBC/hpf   RBC / HPF 0-2  <3 RBC/hpf   Bacteria, UA MANY (*) RARE  CBC     Status: Abnormal   Collection Time    04/29/13  1:40 PM      Result Value Range   WBC 10.6 (*) 4.0 - 10.5 K/uL   RBC 4.12  3.87 - 5.11 MIL/uL   Hemoglobin 10.2 (*) 12.0 -  15.0 g/dL   HCT 09.8 (*) 11.9 - 14.7 %   MCV 72.1 (*) 78.0 - 100.0 fL   MCH 24.8 (*) 26.0 - 34.0 pg   MCHC 34.3  30.0 - 36.0 g/dL   RDW 82.9  56.2 - 13.0 %   Platelets 291  150 - 400 K/uL  COMPREHENSIVE METABOLIC PANEL     Status: Abnormal   Collection Time    04/29/13  1:40 PM      Result Value Range   Sodium 139  135 - 145 mEq/L   Potassium 2.9 (*) 3.5 - 5.1 mEq/L   Chloride 105  96 - 112 mEq/L   CO2 20  19 - 32 mEq/L   Glucose, Bld 96  70 - 99 mg/dL   BUN 4 (*) 6 - 23 mg/dL   Creatinine, Ser 8.65  0.50 - 1.10 mg/dL   Calcium 8.6  8.4 - 78.4 mg/dL   Total Protein 6.7  6.0 - 8.3 g/dL   Albumin 2.7 (*) 3.5 - 5.2 g/dL   AST 11  0 - 37 U/L   ALT 7  0 - 35 U/L   Alkaline Phosphatase 52  39 - 117 U/L   Total Bilirubin 0.3  0.3 - 1.2 mg/dL   GFR calc non Af Amer >90  >90 mL/min   GFR calc Af Amer >90  >90 mL/min     Assessment and Plan  UTI Hypokalemia Nausea and Vomiting of Pregnancy  Plan: Discharge to home RX Keflex 500 mg TID x 7 days KDur 20 MEQ BID x 5 days  [facility] 04/29/2013, 1:47 PM

## 2013-05-01 LAB — URINE CULTURE: Colony Count: >=100000

## 2013-05-06 ENCOUNTER — Ambulatory Visit (HOSPITAL_COMMUNITY)
Admission: RE | Admit: 2013-05-06 | Discharge: 2013-05-06 | Disposition: A | Discharge status: Home / Self Care | Payer: Medicaid Other | Source: Ambulatory Visit | Attending: Obstetrics | Admitting: Obstetrics

## 2013-05-06 ENCOUNTER — Encounter (HOSPITAL_COMMUNITY): Payer: Self-pay

## 2013-05-06 DIAGNOSIS — E669 Obesity, unspecified: Secondary | ICD-10-CM | POA: Insufficient documentation

## 2013-05-06 DIAGNOSIS — Z363 Encounter for antenatal screening for malformations: Secondary | ICD-10-CM | POA: Insufficient documentation

## 2013-05-06 DIAGNOSIS — Z1389 Encounter for screening for other disorder: Secondary | ICD-10-CM | POA: Insufficient documentation

## 2013-05-06 DIAGNOSIS — O352XX0 Maternal care for (suspected) hereditary disease in fetus, not applicable or unspecified: Secondary | ICD-10-CM | POA: Insufficient documentation

## 2013-05-06 DIAGNOSIS — O280 Abnormal hematological finding on antenatal screening of mother: Secondary | ICD-10-CM

## 2013-05-06 DIAGNOSIS — O289 Unspecified abnormal findings on antenatal screening of mother: Secondary | ICD-10-CM | POA: Insufficient documentation

## 2013-05-06 DIAGNOSIS — O358XX0 Maternal care for other (suspected) fetal abnormality and damage, not applicable or unspecified: Secondary | ICD-10-CM | POA: Insufficient documentation

## 2013-05-06 NOTE — Progress Notes (Signed)
Genetic Counseling  High-Risk Gestation Note  Appointment Date:  05/06/2013 Referred By: Brock Bad, MD Date of Birth:  04/17/1981 Partner: Megan [name]   Pregnancy History: [id number] Estimated Date of Delivery: 08/29/13 Estimated Gestational Age: [redacted]w[redacted]d Attending: Particia Nearing, MD   Ms. Megan [name] was seen for genetic counseling because of an increased risk for fetal Down syndrome based on maternal serum Quad screening through [employer].  She was counseled regarding the Quad screen result and the associated 1 in 194 risk for fetal Down syndrome.  We reviewed chromosomes, nondisjunction, and the common features and variable prognosis of Down syndrome.  In addition, we reviewed the screen adjusted reduction in risks for trisomy 18 and ONTDs.  We also discussed other explanations for a screen positive result including: a gestational dating error, differences in maternal metabolism, and normal variation.   We reviewed available screening options including noninvasive prenatal screening (NIPS)/cell free fetal DNA (cffDNA) testing, and detailed ultrasound.  She was counseled that screening tests are used to modify a patient's a priori risk for aneuploidy, typically based on age. This estimate provides a pregnancy specific risk assessment. We reviewed the benefits and limitations of each option. Specifically, we discussed the conditions for which each test screens, the detection rates, and false positive rates of each. She was also counseled regarding diagnostic testing via amniocentesis. We reviewed the approximate 1 in 300-500 risk for complications for amniocentesis, including spontaneous pregnancy loss.   After consideration of all the options, she expressed interest in having a detailed ultrasound.  A complete ultrasound was performed today. The ultrasound report will be documented separately. There were no visualized fetal anomalies or markers suggestive of aneuploidy.  NIPS/cffDNA testing and diagnostic testing were declined today.  She understands that screening tests cannot rule out all birth defects or genetic syndromes. The patient was advised of this limitation and states she still does not want additional testing at this time.   Ms. Megan [name] was provided with written information regarding sickle cell anemia (SCA) including the carrier frequency and incidence in the African-American population, the availability of carrier testing and prenatal diagnosis if indicated.  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 We discussed that SCA affects the shape and function of the red blood cell by producing abnormal hemoglobin. They were counseled that hemoglobin is a protein in the RBCs that carries oxygen to the body's organs. Individuals who have SCA have a changes within the genes that codes for hemoglobin. Ms. Megan [name] was counseled that SCA is inherited in an autosomal recessive manner, and occurs when both copies of the hemoglobin gene are changed and produce an abnormal hemoglobin S. Typically, one abnormal gene for the production of hemoglobin S is inherited from each parent. A carrier of SCA has one altered copy of the gene for hemoglobin and one typical working copy. Carriers of recessive conditions typically do not have symptoms related to the condition because they still have one functioning copy of the gene, and thus some production of the typical protein coded for by that gene.  Given the recessive inheritance, we discussed the importance of understanding the father of the baby's carrier status in order to accurately predict the risk of a hemoglobinopathy in the fetus. Ms. Megan [name] reported that her partner had testing for SCA in the past and that he does NOT have SCT. Medical records were not available to verify the reported information. We discussed that other hemoglobin variants (hemoglobin C), when combined with hemoglobin S,  can cause a medically  significant hemoglobinopathy. We discussed the option of the father of the baby having repeat testing (hemoglobin electrophoresis) to determine whether he has any hemoglobin variant, including SCT. Further testing was declined today.  Ms. Megan [name] understands that an accurate risk assessment cannot be performed without further information. However, assuming that the father of the baby does not have SCT or any other hemoglobin variant, the fetus would not be expected to have an increased risk for a hemoglobinopathy.  We also reviewed the availability of newborn screening in [place] for hemoglobinopathies.   Both family histories were reviewed and found to be noncontributory for birth defects, mental retardation, and known genetic conditions.  Without further information regarding the provided family history, an accurate genetic risk cannot be calculated. Further genetic counseling is warranted if more information is obtained.  Ms. Megan [name] denied exposure to environmental toxins or chemical agents. She denied the use of alcohol, tobacco or street drugs. She denied significant viral illnesses during the course of her pregnancy.    I counseled this couple for approximately 38 minutes regarding the above risks and available options.   Megan Arias, MS Certified Genetic Counselor

## 2013-05-07 ENCOUNTER — Other Ambulatory Visit: Payer: Medicaid Other

## 2013-05-07 ENCOUNTER — Ambulatory Visit (INDEPENDENT_AMBULATORY_CARE_PROVIDER_SITE_OTHER): Payer: Medicaid Other | Admitting: Obstetrics

## 2013-05-07 ENCOUNTER — Encounter: Payer: Self-pay | Admitting: Obstetrics

## 2013-05-07 VITALS — BP 129/85 | Temp 97.8°F | Wt 249.0 lb

## 2013-05-07 DIAGNOSIS — Z3480 Encounter for supervision of other normal pregnancy, unspecified trimester: Secondary | ICD-10-CM

## 2013-05-07 LAB — CBC
HCT: 30.1 % — ABNORMAL LOW (ref 36.0–46.0)
Hemoglobin: 10.3 g/dL — ABNORMAL LOW (ref 12.0–15.0)
MCH: 24.3 pg — ABNORMAL LOW (ref 26.0–34.0)
MCV: 71 fL — ABNORMAL LOW (ref 78.0–100.0)
RBC: 4.24 MIL/uL (ref 3.87–5.11)
WBC: 10.3 10*3/uL (ref 4.0–10.5)

## 2013-05-07 LAB — POCT URINALYSIS DIPSTICK
Bilirubin, UA: NEGATIVE
Glucose, UA: NEGATIVE
Spec Grav, UA: <=1.005
Urobilinogen, UA: 4

## 2013-05-07 NOTE — Progress Notes (Signed)
P-103 Pt states she has some tightening with walking. P states once she stops and rests then they go away.

## 2013-05-08 LAB — GLUCOSE TOLERANCE, 2 HOURS W/ 1HR: Glucose, 2 hour: 82 mg/dL (ref 70–139)

## 2013-05-08 LAB — RPR

## 2013-05-08 LAB — HIV ANTIBODY (ROUTINE TESTING W REFLEX): HIV: NONREACTIVE

## 2013-05-21 ENCOUNTER — Ambulatory Visit (INDEPENDENT_AMBULATORY_CARE_PROVIDER_SITE_OTHER): Payer: Medicaid Other | Admitting: Obstetrics

## 2013-05-21 VITALS — BP 123/80 | Temp 98.4°F | Wt 252.0 lb

## 2013-05-21 DIAGNOSIS — Z3480 Encounter for supervision of other normal pregnancy, unspecified trimester: Secondary | ICD-10-CM

## 2013-05-21 DIAGNOSIS — Z3482 Encounter for supervision of other normal pregnancy, second trimester: Secondary | ICD-10-CM

## 2013-05-21 NOTE — Progress Notes (Signed)
Pulse- 96 

## 2013-06-05 ENCOUNTER — Encounter: Payer: Self-pay | Admitting: Obstetrics

## 2013-06-05 ENCOUNTER — Ambulatory Visit (INDEPENDENT_AMBULATORY_CARE_PROVIDER_SITE_OTHER): Payer: Medicaid Other | Admitting: Obstetrics

## 2013-06-05 VITALS — BP 134/86 | Temp 98.1°F | Wt 252.4 lb

## 2013-06-05 DIAGNOSIS — Z3482 Encounter for supervision of other normal pregnancy, second trimester: Secondary | ICD-10-CM

## 2013-06-05 DIAGNOSIS — Z3480 Encounter for supervision of other normal pregnancy, unspecified trimester: Secondary | ICD-10-CM

## 2013-06-05 DIAGNOSIS — Z3483 Encounter for supervision of other normal pregnancy, third trimester: Secondary | ICD-10-CM

## 2013-06-05 LAB — POCT URINALYSIS DIPSTICK
Glucose, UA: NEGATIVE
Nitrite, UA: NEGATIVE
Protein, UA: NEGATIVE
Urobilinogen, UA: NEGATIVE

## 2013-06-05 NOTE — Progress Notes (Signed)
Pulse- 94 

## 2013-06-19 ENCOUNTER — Encounter: Payer: Self-pay | Admitting: Obstetrics

## 2013-06-19 ENCOUNTER — Ambulatory Visit (INDEPENDENT_AMBULATORY_CARE_PROVIDER_SITE_OTHER): Payer: Medicaid Other | Admitting: Obstetrics

## 2013-06-19 VITALS — BP 125/80 | Temp 97.3°F | Wt 253.0 lb

## 2013-06-19 DIAGNOSIS — Z3480 Encounter for supervision of other normal pregnancy, unspecified trimester: Secondary | ICD-10-CM

## 2013-06-19 DIAGNOSIS — Z3483 Encounter for supervision of other normal pregnancy, third trimester: Secondary | ICD-10-CM

## 2013-06-19 DIAGNOSIS — Z3482 Encounter for supervision of other normal pregnancy, second trimester: Secondary | ICD-10-CM

## 2013-06-19 LAB — POCT URINALYSIS DIPSTICK
Ketones, UA: NEGATIVE
Leukocytes, UA: NEGATIVE
Spec Grav, UA: 1.01
Urobilinogen, UA: NEGATIVE
pH, UA: 6

## 2013-06-19 NOTE — Progress Notes (Signed)
HR - 101 Routine OB visit, reports discomfort in lower abd and pelvis, denies new concerns at this time.

## 2013-07-03 ENCOUNTER — Encounter: Payer: Self-pay | Admitting: Obstetrics

## 2013-07-03 ENCOUNTER — Ambulatory Visit (INDEPENDENT_AMBULATORY_CARE_PROVIDER_SITE_OTHER): Payer: Medicaid Other | Admitting: Obstetrics

## 2013-07-03 VITALS — BP 125/80 | Temp 98.0°F | Wt 248.0 lb

## 2013-07-03 DIAGNOSIS — Z3480 Encounter for supervision of other normal pregnancy, unspecified trimester: Secondary | ICD-10-CM

## 2013-07-03 DIAGNOSIS — B373 Candidiasis of vulva and vagina: Secondary | ICD-10-CM

## 2013-07-03 DIAGNOSIS — Z3483 Encounter for supervision of other normal pregnancy, third trimester: Secondary | ICD-10-CM

## 2013-07-03 LAB — POCT URINALYSIS DIPSTICK
Bilirubin, UA: NEGATIVE
Glucose, UA: NEGATIVE
Ketones, UA: NEGATIVE
Spec Grav, UA: 1.01
Urobilinogen, UA: NEGATIVE

## 2013-07-03 MED ORDER — FLUCONAZOLE 150 MG PO TABS: 150.0000 mg | ORAL_TABLET | Freq: Once | ORAL | Status: DC

## 2013-07-03 NOTE — Progress Notes (Signed)
Pulse- 101 Pt states she is having pressure in lower abdomen.

## 2013-07-17 ENCOUNTER — Ambulatory Visit (INDEPENDENT_AMBULATORY_CARE_PROVIDER_SITE_OTHER): Payer: Medicaid Other | Admitting: Obstetrics

## 2013-07-17 ENCOUNTER — Encounter: Payer: Medicaid Other | Admitting: Obstetrics

## 2013-07-17 VITALS — BP 138/82 | Temp 98.5°F | Wt 249.4 lb

## 2013-07-17 DIAGNOSIS — Z3480 Encounter for supervision of other normal pregnancy, unspecified trimester: Secondary | ICD-10-CM

## 2013-07-17 DIAGNOSIS — Z3483 Encounter for supervision of other normal pregnancy, third trimester: Secondary | ICD-10-CM

## 2013-07-17 LAB — POCT URINALYSIS DIPSTICK
Bilirubin, UA: NEGATIVE
Glucose, UA: NEGATIVE
Spec Grav, UA: 1.015

## 2013-07-17 NOTE — Progress Notes (Signed)
Pulse 111  

## 2013-07-18 ENCOUNTER — Encounter: Payer: Self-pay | Admitting: Obstetrics

## 2013-07-23 ENCOUNTER — Encounter: Payer: Medicaid Other | Admitting: Obstetrics

## 2013-07-29 ENCOUNTER — Encounter (HOSPITAL_COMMUNITY): Payer: Self-pay | Admitting: *Deleted

## 2013-07-29 ENCOUNTER — Inpatient Hospital Stay (HOSPITAL_COMMUNITY)
Admission: AD | Admit: 2013-07-29 | Discharge: 2013-07-29 | Disposition: A | Discharge status: Home / Self Care | Payer: Medicaid Other | Source: Ambulatory Visit | Attending: Obstetrics | Admitting: Obstetrics

## 2013-07-29 DIAGNOSIS — O479 False labor, unspecified: Secondary | ICD-10-CM

## 2013-07-29 DIAGNOSIS — O4700 False labor before 37 completed weeks of gestation, unspecified trimester: Secondary | ICD-10-CM | POA: Insufficient documentation

## 2013-07-29 LAB — URINE MICROSCOPIC-ADD ON

## 2013-07-29 LAB — URINALYSIS, ROUTINE W REFLEX MICROSCOPIC
Bilirubin Urine: NEGATIVE
Glucose, UA: NEGATIVE mg/dL
Nitrite: NEGATIVE
Specific Gravity, Urine: 1.015 (ref 1.005–1.030)
Urobilinogen, UA: 2 mg/dL — ABNORMAL HIGH (ref 0.0–1.0)
pH: 7 (ref 5.0–8.0)

## 2013-07-29 NOTE — MAU Provider Note (Signed)
History     CSN: [id number]  Arrival date and time: 07/29/13 7829   First Provider Initiated Contact with Patient 07/29/13 1919      Chief Complaint  Patient presents with  . Labor Eval   HPI  Pt is 32 yo G5P3104 at [redacted]w[redacted]d weeks IUP here with report of contractions and lower pelvic pressure that started around 2 pm.  No report of vaginal bleeding or leaking of fluid.  Denies intercourse in past 48 hours and no complications during pregnancy.    Past Medical History  Diagnosis Date  . Obese   . Thyroid disease   . Sickle cell trait   . Anemia     Past Surgical History  Procedure Laterality Date  . Knee surgery    . Finger nail surgery    . Wisdom tooth extraction      Family History  Problem Relation Age of Onset  . Hypertension Mother   . Diabetes Mother   . Heart disease Mother   . Sickle cell trait Mother     History  Substance Use Topics  . Smoking status: Former Smoker    Types: Cigarettes    Quit date: 01/01/2013  . Smokeless tobacco: Not on file  . Alcohol Use: No    Allergies: No Known Allergies  Prescriptions prior to admission  Medication Sig Dispense Refill  . ondansetron (ZOFRAN) 4 MG tablet Take 4 mg by mouth every 8 (eight) hours as needed for nausea.      . potassium chloride SA (K-DUR,KLOR-CON) 20 MEQ tablet Take 1 tablet (20 mEq total) by mouth 2 (two) times daily.  10 tablet  0    Review of Systems  Gastrointestinal: Positive for abdominal pain (contractions).  Genitourinary:       Pelvic pressure  All other systems reviewed and are negative.   Physical Exam   Blood pressure 134/83, pulse 100, temperature 98.3 F (36.8 C), temperature source Oral, resp. rate 20, height 5' 2.5" (1.588 m), weight 112.946 kg (249 lb), last menstrual period 11/22/2012.  Physical Exam  Constitutional: She is oriented to person, place, and time. She appears well-developed and well-nourished. No distress.  HENT:  Head: Normocephalic.  Neck: Normal  range of motion. Neck supple.  Cardiovascular: Normal rate, regular rhythm and normal heart sounds.   Respiratory: Effort normal and breath sounds normal.  GI: Soft. There is no tenderness.  Genitourinary: No bleeding around the vagina. Vaginal discharge (mucusy) found.  Neurological: She is alert and oriented to person, place, and time.  Skin: Skin is warm and dry.   Dilation: Closed (INTERNAL,  EXTERNAL- 2 CM) Exam by:: Yatziry Deakins, CNM  FHR 130's, +accels Toco - irritability  MAU Course  Procedures  Results for orders placed during the hospital encounter of 07/29/13 (from the past 24 hour(s))  URINALYSIS, ROUTINE W REFLEX MICROSCOPIC     Status: Abnormal   Collection Time    07/29/13  6:52 PM      Result Value Range   Color, Urine YELLOW  YELLOW   APPearance CLEAR  CLEAR   Specific Gravity, Urine 1.015  1.005 - 1.030   pH 7.0  5.0 - 8.0   Glucose, UA NEGATIVE  NEGATIVE mg/dL   Hgb urine dipstick NEGATIVE  NEGATIVE   Bilirubin Urine NEGATIVE  NEGATIVE   Ketones, ur 15 (*) NEGATIVE mg/dL   Protein, ur NEGATIVE  NEGATIVE mg/dL   Urobilinogen, UA 2.0 (*) 0.0 - 1.0 mg/dL   Nitrite NEGATIVE  NEGATIVE  Leukocytes, UA SMALL (*) NEGATIVE  URINE MICROSCOPIC-ADD ON     Status: Abnormal   Collection Time    07/29/13  6:52 PM      Result Value Range   Squamous Epithelial / LPF FEW (*) RARE   WBC, UA 3-6  <3 WBC/hpf   RBC / HPF 0-2  <3 RBC/hpf   Bacteria, UA FEW (*) RARE    Assessment and Plan  32 yo G5P3104 at [redacted]w[redacted]d wks IUP Braxton Hick's Category I FHR Tracing  Plan: Discharge to home Preterm labor precautions Keep scheduled appointment  [facility] 07/29/2013, 7:20 PM

## 2013-07-29 NOTE — MAU Note (Signed)
Rectal pressure, abd has been tightening up. Baby is moving way more than usual.

## 2013-07-31 ENCOUNTER — Ambulatory Visit (INDEPENDENT_AMBULATORY_CARE_PROVIDER_SITE_OTHER): Payer: Medicaid Other | Admitting: Obstetrics

## 2013-07-31 VITALS — BP 127/88 | Temp 98.1°F | Wt 251.0 lb

## 2013-07-31 DIAGNOSIS — O3663X1 Maternal care for excessive fetal growth, third trimester, fetus 1: Secondary | ICD-10-CM

## 2013-07-31 DIAGNOSIS — O3660X0 Maternal care for excessive fetal growth, unspecified trimester, not applicable or unspecified: Secondary | ICD-10-CM

## 2013-07-31 DIAGNOSIS — Z3483 Encounter for supervision of other normal pregnancy, third trimester: Secondary | ICD-10-CM

## 2013-07-31 DIAGNOSIS — Z3480 Encounter for supervision of other normal pregnancy, unspecified trimester: Secondary | ICD-10-CM

## 2013-07-31 LAB — POCT URINALYSIS DIPSTICK
Blood, UA: NEGATIVE
Glucose, UA: NEGATIVE
Spec Grav, UA: 1.015
Urobilinogen, UA: 4

## 2013-07-31 LAB — URINE CULTURE: Colony Count: >=100000

## 2013-07-31 NOTE — Progress Notes (Signed)
Pulse- 91 Pt states she is haivng pain and pressure in lower abdomen.

## 2013-08-01 ENCOUNTER — Encounter: Payer: Self-pay | Admitting: Obstetrics

## 2013-08-05 ENCOUNTER — Ambulatory Visit (HOSPITAL_COMMUNITY)
Admission: RE | Admit: 2013-08-05 | Discharge: 2013-08-05 | Disposition: A | Discharge status: Home / Self Care | Payer: Medicaid Other | Source: Ambulatory Visit | Attending: Obstetrics | Admitting: Obstetrics

## 2013-08-05 DIAGNOSIS — O3660X0 Maternal care for excessive fetal growth, unspecified trimester, not applicable or unspecified: Secondary | ICD-10-CM | POA: Insufficient documentation

## 2013-08-05 DIAGNOSIS — O352XX0 Maternal care for (suspected) hereditary disease in fetus, not applicable or unspecified: Secondary | ICD-10-CM | POA: Insufficient documentation

## 2013-08-05 DIAGNOSIS — O3663X1 Maternal care for excessive fetal growth, third trimester, fetus 1: Secondary | ICD-10-CM

## 2013-08-05 DIAGNOSIS — E669 Obesity, unspecified: Secondary | ICD-10-CM | POA: Insufficient documentation

## 2013-08-05 DIAGNOSIS — O289 Unspecified abnormal findings on antenatal screening of mother: Secondary | ICD-10-CM | POA: Insufficient documentation

## 2013-08-07 ENCOUNTER — Encounter: Payer: Medicaid Other | Admitting: Obstetrics

## 2013-08-07 ENCOUNTER — Ambulatory Visit (INDEPENDENT_AMBULATORY_CARE_PROVIDER_SITE_OTHER): Payer: Medicaid Other | Admitting: Obstetrics

## 2013-08-07 VITALS — BP 140/85 | Temp 98.3°F | Wt 251.0 lb

## 2013-08-07 DIAGNOSIS — Z3483 Encounter for supervision of other normal pregnancy, third trimester: Secondary | ICD-10-CM

## 2013-08-07 DIAGNOSIS — Z3480 Encounter for supervision of other normal pregnancy, unspecified trimester: Secondary | ICD-10-CM

## 2013-08-07 LAB — POCT URINALYSIS DIPSTICK
Ketones, UA: NEGATIVE
Nitrite, UA: NEGATIVE
Protein, UA: NEGATIVE
Spec Grav, UA: 1.01

## 2013-08-07 NOTE — Progress Notes (Signed)
Pulse 91 Pt is doing well.  Pt needs GBS today.

## 2013-08-08 ENCOUNTER — Encounter: Payer: Self-pay | Admitting: Obstetrics

## 2013-08-09 LAB — STREP B DNA PROBE: GBSP: NEGATIVE

## 2013-08-12 ENCOUNTER — Encounter (HOSPITAL_COMMUNITY): Payer: Self-pay | Admitting: *Deleted

## 2013-08-12 ENCOUNTER — Inpatient Hospital Stay (HOSPITAL_COMMUNITY)
Admission: AD | Admit: 2013-08-12 | Discharge: 2013-08-12 | Disposition: A | Discharge status: Home / Self Care | Payer: Medicaid Other | Source: Ambulatory Visit | Attending: Obstetrics | Admitting: Obstetrics

## 2013-08-12 DIAGNOSIS — O479 False labor, unspecified: Secondary | ICD-10-CM | POA: Insufficient documentation

## 2013-08-12 NOTE — MAU Note (Signed)
PT SAYS  SHE  STARTED HURTING   BAD AT 930PM.       VE  LAST VISIT HERE - 1 CM. HAD U/S ON Tuesday.  DENIES HSV AND MRSA.

## 2013-08-12 NOTE — MAU Note (Signed)
Pt states she started having contractions having contractions Sunday and they got bad today. Pt states contractions got bad today

## 2013-08-12 NOTE — Progress Notes (Signed)
Prolonged accelerations noted.

## 2013-08-14 ENCOUNTER — Encounter: Payer: Medicaid Other | Admitting: Obstetrics

## 2013-08-19 ENCOUNTER — Other Ambulatory Visit: Payer: Self-pay | Admitting: Obstetrics & Gynecology

## 2013-08-19 ENCOUNTER — Encounter: Payer: Self-pay | Admitting: *Deleted

## 2013-08-19 ENCOUNTER — Encounter: Payer: Self-pay | Admitting: Obstetrics & Gynecology

## 2013-08-19 ENCOUNTER — Other Ambulatory Visit: Payer: Self-pay | Admitting: *Deleted

## 2013-08-19 ENCOUNTER — Ambulatory Visit (INDEPENDENT_AMBULATORY_CARE_PROVIDER_SITE_OTHER): Payer: Medicaid Other | Admitting: Obstetrics & Gynecology

## 2013-08-19 ENCOUNTER — Ambulatory Visit (HOSPITAL_COMMUNITY)
Admission: RE | Admit: 2013-08-19 | Discharge: 2013-08-19 | Disposition: A | Discharge status: Home / Self Care | Payer: Medicaid Other | Source: Ambulatory Visit | Attending: Obstetrics & Gynecology | Admitting: Obstetrics & Gynecology

## 2013-08-19 VITALS — BP 139/93 | Temp 98.7°F | Wt 253.0 lb

## 2013-08-19 DIAGNOSIS — O139 Gestational [pregnancy-induced] hypertension without significant proteinuria, unspecified trimester: Secondary | ICD-10-CM

## 2013-08-19 DIAGNOSIS — O133 Gestational [pregnancy-induced] hypertension without significant proteinuria, third trimester: Secondary | ICD-10-CM

## 2013-08-19 DIAGNOSIS — Z3483 Encounter for supervision of other normal pregnancy, third trimester: Secondary | ICD-10-CM

## 2013-08-19 DIAGNOSIS — N390 Urinary tract infection, site not specified: Secondary | ICD-10-CM

## 2013-08-19 DIAGNOSIS — E669 Obesity, unspecified: Secondary | ICD-10-CM

## 2013-08-19 DIAGNOSIS — Z641 Problems related to multiparity: Secondary | ICD-10-CM | POA: Insufficient documentation

## 2013-08-19 DIAGNOSIS — O99213 Obesity complicating pregnancy, third trimester: Secondary | ICD-10-CM

## 2013-08-19 DIAGNOSIS — Z3480 Encounter for supervision of other normal pregnancy, unspecified trimester: Secondary | ICD-10-CM

## 2013-08-19 DIAGNOSIS — O288 Other abnormal findings on antenatal screening of mother: Secondary | ICD-10-CM

## 2013-08-19 DIAGNOSIS — O289 Unspecified abnormal findings on antenatal screening of mother: Secondary | ICD-10-CM | POA: Insufficient documentation

## 2013-08-19 LAB — AST: AST: 12 U/L (ref 0–37)

## 2013-08-19 LAB — POCT URINALYSIS DIPSTICK
Bilirubin, UA: NEGATIVE
Glucose, UA: NEGATIVE
Leukocytes, UA: NEGATIVE
Nitrite, UA: NEGATIVE

## 2013-08-19 LAB — CBC
Platelets: 351 10*3/uL (ref 150–400)
RDW: 16.2 % — ABNORMAL HIGH (ref 11.5–15.5)
WBC: 8.8 10*3/uL (ref 4.0–10.5)

## 2013-08-19 LAB — LACTATE DEHYDROGENASE: LDH: 181 U/L (ref 94–250)

## 2013-08-19 LAB — ALT: ALT: 8 U/L (ref 0–35)

## 2013-08-19 LAB — CREATININE, SERUM: Creat: 0.53 mg/dL (ref 0.50–1.10)

## 2013-08-19 MED ORDER — NITROFURANTOIN MONOHYD MACRO 100 MG PO CAPS: 100.0000 mg | ORAL_CAPSULE | Freq: Two times a day (BID) | ORAL | Status: DC

## 2013-08-19 NOTE — Progress Notes (Signed)
HR - 112 Pt in office for routine OB visit, was in ER with contractions last week, having low back pain, would like discuss tubal ligation.  U/S today.  IOL for gestational hypertension.

## 2013-08-19 NOTE — Patient Instructions (Signed)
Hypertension During Pregnancy Hypertension is also called high blood pressure. It can occur at any time in life and during pregnancy. When you have hypertension, there is extra pressure inside your blood vessels that carry blood from the heart to the rest of your body (arteries). Hypertension during pregnancy can cause problems for you and your baby. Your baby might not weigh as much as it should at birth or might be born early (premature). Very bad cases of hypertension during pregnancy can be life-threatening.  There are different types of hypertension during pregnancy.   Chronic hypertension. This happens when a woman has hypertension before pregnancy and it continues during pregnancy.  Gestational hypertension. This is when hypertension develops during pregnancy.  Preeclampsia or toxemia of pregnancy. This is a very serious type of hypertension that develops only during pregnancy. It is a disease that affects the whole body (systemic) and can be very dangerous for both mother and baby.  Gestational hypertension and preeclampsia usually go away after your baby is born. Blood pressure generally stabilizes within 6 weeks. Women who have hypertension during pregnancy have a greater chance of developing hypertension later in life or with future pregnancies. UNDERSTANDING BLOOD PRESSURE Blood pressure moves blood in your body. Sometimes, the force that moves the blood becomes too strong.  A blood pressure reading is given in 2 numbers and looks like a fraction.  The top number is called the systolic pressure. When your heart beats, it forces more blood to flow through the arteries. Pressure inside the arteries goes up.  The bottom number is the diastolic pressure. Pressure goes down between beats. That is when the heart is resting.  You may have hypertension if:  Your systolic blood pressure is above 140.  Your diastolic pressure is above 90. RISK FACTORS Some factors make you more likely to  develop hypertension during pregnancy. Risk factors include:  Having hypertension before pregnancy.  Having hypertension during a previous pregnancy.  Being overweight.  Being older than 40.  Being pregnant with more than 1 baby (multiples).  Having diabetes or kidney problems. SYMPTOMS Chronic and gestational hypertension may not cause symptoms. Preeclampsia has symptoms, which may include:  Increased protein in your urine. Your caregiver will check for this at every prenatal visit.  Swelling of your hands and face.  Rapid weight gain.  Headaches.  Visual changes.  Being bothered by light.  Abdominal pain, especially in the right upper area.  Chest pain.  Shortness of breath.  Increased reflexes.  Seizures. Seizures occur with a more severe form of preeclampsia, called eclampsia. DIAGNOSIS   You may be diagnosed with hypertension during pregnancy during a regular prenatal exam. At each visit, tests may include:  Blood pressure checks.  A urine test to check for protein in your urine.  The type of hypertension you are diagnosed with depends on when you developed it. It also depends on your specific blood pressure reading.  Developing hypertension before 20 weeks of pregnancy is consistent with chronic hypertension.  Developing hypertension after 20 weeks of pregnancy is consistent with gestational hypertension.  Hypertension with increased urinary protein is diagnosed as preeclampsia.  Blood pressure measurements that stay above 160 systolic or 110 diastolic are a sign of severe preeclampsia. TREATMENT Treatment for hypertension during pregnancy varies. Treatment depends on the type of hypertension and how serious it is.  If you take medicine for chronic hypertension, you may need to switch medicines.  Drugs called ACE inhibitors should not be taken during pregnancy.    Low-dose aspirin may be suggested for women who have risk factors for preeclampsia.  If  you have gestational hypertension, you may need to take a blood pressure medicine that is safe during pregnancy. Your caregiver will recommend the appropriate medicine.  If you have severe preeclampsia, you may need to be in the hospital. Caregivers will watch you and the baby very closely. You also may need to take medicine (magnesium sulfate) to prevent seizures and lower blood pressure.  Sometimes an early delivery is needed. This may be the case if the condition worsens. It would be done to protect you and the baby. The only cure for preeclampsia is delivery. HOME CARE INSTRUCTIONS  Schedule and keep all of your regular prenatal care.  Follow your caregiver's instructions for taking medicines. Tell your caregiver about all medicines you take. This includes over-the-counter medicines.  Eat as little salt as possible.  Get regular exercise.  Do not drink alcohol.  Do not use tobacco products.  Do not drink products with caffeine.  Lie on your left side when resting.  Tell your doctor if you have any preeclampsia symptoms. SEEK IMMEDIATE MEDICAL CARE IF:  You have severe abdominal pain.  You have sudden swelling in the hands, ankles, or face.  You gain 4 pounds (1.8 kg) or more in 1 week.  You vomit repeatedly.  You have vaginal bleeding.  You do not feel the baby moving as much.  You have a headache.  You have blurred or double vision.  You have muscle twitching or spasms.  You have shortness of breath.  You have blue fingernails and lips.  You have blood in your urine. MAKE SURE YOU:  Understand these instructions.  Will watch your condition.  Will get help right away if you are not doing well. Document Released: 05/02/2011 Document Revised: 11/06/2011 Document Reviewed: 05/02/2011 [facility] Patient Information 2014 [place], [place]. Laparoscopic Tubal Ligation Laparoscopic tubal ligation is a procedure that closes the fallopian tubes at a time other than  right after childbirth. By closing the fallopian tubes, the eggs that are released from the ovaries cannot enter the uterus and sperm cannot reach the egg. Tubal ligation is also known as getting your "tubes tied." Tubal ligation is done so you will not be able to get pregnant or have a baby.  Although this procedure may be reversed, it should be considered permanent and irreversible. If you want to have future pregnancies, you should not have this procedure.  LET YOUR CAREGIVER KNOW ABOUT:  Allergies to food or medicine.  Medicines taken, including vitamins, herbs, eyedrops, over-the-counter medicines, and creams.  Use of steroids (by mouth or creams).  Previous problems with numbing medicines.  History of bleeding problems or blood clots.  Any recent colds or infections.  Previous surgery.  Other health problems, including diabetes and kidney problems.  Possibility of pregnancy, if this applies.  Any past pregnancies. RISKS AND COMPLICATIONS   Infection.  Bleeding.  Injury to surrounding organs.  Anesthetic side effects.  Failure of the procedure.  Ectopic pregnancy.  Future regret about having the procedure done. BEFORE THE PROCEDURE  Do not take aspirin or blood thinners a week before the procedure or as directed. This can cause bleeding.  Do not eat or drink anything 6 to 8 hours before the procedure. PROCEDURE   You may be given a medicine to help you relax (sedative) before the procedure. You will be given a medicine to make you sleep (general anesthetic) during the procedure.  A tube will be put down your throat to help your breath while under general anesthesia.  Two small cuts (incisions) are made in the lower abdominal area and near the belly button.  Your abdominal area will be inflated with a safe gas (carbon dioxide). This helps give the surgeon room to operate, visualize, and helps the surgeon avoid other organs.  A thin, lighted tube (laparoscope)  with a camera attached is inserted into your abdomen through one of the incisions near the belly button. Other small instruments are also inserted through the other abdominal incision.  The fallopian tubes are located and are either blocked with a ring, clip, or are burned (cauterized).  After the fallopian tubes are blocked, the gas is released from the abdomen.  The incisions will be closed with stitches (sutures), and a bandage may be placed over the incisions. AFTER THE PROCEDURE   You will rest in a recovery room for 1 4 hours until you are stable and doing well.  You will also have some mild abdominal discomfort for 3 7 days. You will be given pain medicine to ease any discomfort.  As long as there are no problems, you may be allowed to go home. Someone will need to drive you home and be with you for at least 24 hours once home.  You may have some mild discomfort in the throat. This is from the tube placed in your throat while you were sleeping.  You may experience discomfort in the shoulder area from some trapped air between the liver and diaphragm. This sensation is normal and will slowly go away on its own. Document Released: 11/20/2000 Document Revised: 02/13/2012 Document Reviewed: 11/25/2011 [facility] Patient Information 2014 [place], [place]. Sterilization Information, Female Female sterilization is a procedure to permanently prevent pregnancy. There are different ways to perform sterilization, but all either block or close the fallopian tubes so that your eggs cannot reach your uterus. If your egg cannot reach your uterus, sperm cannot fertilize the egg, and you cannot get pregnant.  Sterilization is performed by a surgical procedure. Sometimes these procedures are performed in a hospital while a patient is asleep. Sometimes they can be done in a clinic setting with the patient awake. The fallopian tubes can be surgically cut, tied, or sealed through a procedure called tubal  ligation. The fallopian tubes can also be closed with clips or rings. Sterilization can also be done by placing a tiny coil into each fallopian tube, which causes scar tissue to grow inside the tube. The scar tissue then blocks the tubes.  Discuss sterilization with your caregiver to answer any concerns you or your partner may have. You may want to ask what type of sterilization your caregiver performs. Some caregivers may not perform all the various options. Sterilization is permanent and should only be done if you are sure you do not want children or do not want any more children. Having a sterilization reversed may not be successful.  STERILIZATION PROCEDURES  Laparoscopic sterilization. This is a surgical method performed at a time other than right after childbirth. Two incisions are made in the lower abdomen. A thin, lighted tube (laparoscope) is inserted into one of the incisions and is used to perform the procedure. The fallopian tubes are closed with a ring or a clip. An instrument that uses heat could be used to seal the tubes closed (electrocautery).   Mini-laparotomy. This is a surgical method done 1 or 2 days after giving birth. Typically, a small  incision is made just below the belly button (umbilicus) and the fallopian tubes are exposed. The tubes can then be sealed, tied, or cut.   Hysteroscopic sterilization. This is performed at a time other than right after childbirth. A tiny, spring-like coil is inserted through the cervix and uterus and placed into the fallopian tubes. The coil causes scaring and blocks the tubes. Other forms of contraception should be used for 3 months after the procedure to allow the scar tissue to form completely. Additionally, it is required hysterosalpingography be done 3 months later to ensure that the procedure was successful. Hysterosalpingography is a procedure that uses X-rays to look at your uterus and fallopian tubes after a material to make them show up  better has been inserted. IS STERILIZATION SAFE? Sterilization is considered safe with very rare complications. Risks depend on the type of procedure you have. As with any surgical procedure, there are risks. Some risks of sterilization by any means include:   Bleeding.  Infection.  Reaction to anesthesia medicine.  Injury to surrounding organs. Risks specific to having hysteroscopic coils placed include:  The coils may not be placed correctly the first time.   The coils may move out of place.   The tubes may not get completely blocked after 3 months.   Injury to surrounding organs when placing the coil.  HOW EFFECTIVE IS FEMALE STERILIZATION? Sterilization is nearly 100% effective, but it can fail. Depending on the type of sterilization, the rate of failure can be as high as 3%. After hysteroscopic sterilization with placement of fallopian tube coils, you will need back-up birth control for 3 months after the procedure. Sterilization is effective for a lifetime.  BENEFITS OF STERILIZATION  It does not affect your hormones, and therefore will not affect your menstrual periods, sexual desire, or performance.   It is effective for a lifetime.   It is safe.   You do not need to worry about getting pregnant. Keep in mind that if you had the hysteroscopic placement procedure, you must wait 3 months after the procedure (or until your caregiver confirms) before pregnancy is not considered possible.   There are no side effects unlike other types of birth control (contraception).  DRAWBACKS OF STERILIZATION  You must be sure you do not want children or any more children. The procedure is permanent.   It does not provide protection against sexually transmitted infections (STIs).   The tubes can grow back together. If this happens, there is a risk of pregnancy. There is also an increased risk (50%) of pregnancy being an ectopic pregnancy. This is a pregnancy that happens  outside of the uterus. Document Released: 01/31/2008 Document Revised: 02/13/2012 Document Reviewed: 11/30/2011 [facility] Patient Information 2014 [place], [place].

## 2013-08-20 LAB — PROTEIN / CREATININE RATIO, URINE: Creatinine, Urine: 218.4 mg/dL

## 2013-08-20 LAB — PATHOLOGIST SMEAR REVIEW

## 2013-08-22 ENCOUNTER — Telehealth (HOSPITAL_COMMUNITY): Payer: Self-pay | Admitting: *Deleted

## 2013-08-22 ENCOUNTER — Encounter (HOSPITAL_COMMUNITY): Payer: Self-pay | Admitting: *Deleted

## 2013-08-22 ENCOUNTER — Inpatient Hospital Stay (HOSPITAL_COMMUNITY)
Admission: AD | Admit: 2013-08-22 | Discharge: 2013-08-27 | DRG: 766 | Disposition: A | Discharge status: Home / Self Care | Payer: Medicaid Other | Source: Ambulatory Visit | Attending: Obstetrics & Gynecology | Admitting: Obstetrics & Gynecology

## 2013-08-22 DIAGNOSIS — D573 Sickle-cell trait: Secondary | ICD-10-CM | POA: Diagnosis present

## 2013-08-22 DIAGNOSIS — O139 Gestational [pregnancy-induced] hypertension without significant proteinuria, unspecified trimester: Principal | ICD-10-CM | POA: Diagnosis present

## 2013-08-22 DIAGNOSIS — Z302 Encounter for sterilization: Secondary | ICD-10-CM

## 2013-08-22 DIAGNOSIS — E669 Obesity, unspecified: Secondary | ICD-10-CM | POA: Diagnosis present

## 2013-08-22 DIAGNOSIS — O9902 Anemia complicating childbirth: Secondary | ICD-10-CM | POA: Diagnosis present

## 2013-08-22 DIAGNOSIS — Z87891 Personal history of nicotine dependence: Secondary | ICD-10-CM

## 2013-08-22 DIAGNOSIS — IMO0002 Reserved for concepts with insufficient information to code with codable children: Secondary | ICD-10-CM | POA: Diagnosis present

## 2013-08-22 DIAGNOSIS — R8271 Bacteriuria: Secondary | ICD-10-CM | POA: Diagnosis present

## 2013-08-22 DIAGNOSIS — O368390 Maternal care for abnormalities of the fetal heart rate or rhythm, unspecified trimester, not applicable or unspecified: Secondary | ICD-10-CM | POA: Diagnosis not present

## 2013-08-22 DIAGNOSIS — E876 Hypokalemia: Secondary | ICD-10-CM | POA: Diagnosis present

## 2013-08-22 DIAGNOSIS — O368190 Decreased fetal movements, unspecified trimester, not applicable or unspecified: Secondary | ICD-10-CM | POA: Diagnosis present

## 2013-08-22 LAB — URINE MICROSCOPIC-ADD ON

## 2013-08-22 LAB — URINALYSIS, ROUTINE W REFLEX MICROSCOPIC
Glucose, UA: NEGATIVE mg/dL
Specific Gravity, Urine: 1.01 (ref 1.005–1.030)
pH: 6 (ref 5.0–8.0)

## 2013-08-22 LAB — COMPREHENSIVE METABOLIC PANEL
ALT: 8 U/L (ref 0–35)
Albumin: 2.6 g/dL — ABNORMAL LOW (ref 3.5–5.2)
Alkaline Phosphatase: 107 U/L (ref 39–117)
BUN: 4 mg/dL — ABNORMAL LOW (ref 6–23)
Chloride: 103 mEq/L (ref 96–112)
GFR calc Af Amer: >90 mL/min (ref >90)
GFR calc non Af Amer: >90 mL/min (ref >90)
Glucose, Bld: 111 mg/dL — ABNORMAL HIGH (ref 70–99)
Potassium: 2.8 mEq/L — ABNORMAL LOW (ref 3.5–5.1)
Sodium: 136 mEq/L (ref 135–145)
Total Bilirubin: 0.3 mg/dL (ref 0.3–1.2)

## 2013-08-22 LAB — PROTEIN / CREATININE RATIO, URINE: Creatinine, Urine: 96.64 mg/dL

## 2013-08-22 LAB — CBC
HCT: 29.3 % — ABNORMAL LOW (ref 36.0–46.0)
Hemoglobin: 10 g/dL — ABNORMAL LOW (ref 12.0–15.0)
MCV: 67.4 fL — ABNORMAL LOW (ref 78.0–100.0)
RDW: 15.1 % (ref 11.5–15.5)
WBC: 11.3 10*3/uL — ABNORMAL HIGH (ref 4.0–10.5)

## 2013-08-22 NOTE — MAU Note (Addendum)
BAby not moving as much today. Dizzy. B/P has been high for last couple wks. Was told had uti Tuesday and script is at [facility] but has not picked up yet

## 2013-08-22 NOTE — MAU Provider Note (Signed)
Chief Complaint:  No chief complaint on file.   First Provider Initiated Contact with Patient 08/22/13 2143     HPI: Megan [name] is a 32 y.o. [id number] at [redacted]w[redacted]d who presents to maternity admissions reporting decreased FM and frontal HA since this morning rated 7/10 on pain scale. Has not tried anything for pain. Has Gest HTN. IOl scheduled tomorrow morning at 0800.  Denies vision changes, epigastric pain, contractions, leakage of fluid or vaginal bleeding. Good fetal movement.   Past Medical History: Past Medical History  Diagnosis Date  . Obese   . Thyroid disease   . Sickle cell trait   . Anemia    Patient Active Problem List   Diagnosis Date Noted  . Gestational hypertension 08/19/2013  . Obesity complicating pregnancy in third trimester 08/19/2013  . Grand multipara 08/19/2013   Past obstetric history: OB History  Gravida Para Term Preterm AB SAB TAB Ectopic Multiple Living  5 4 3 1      4     # Outcome Date GA Lbr Len/2nd Weight Sex Delivery Anes PTL Lv  5 CUR           4 TRM 11/23/10    M SVD   Y  3 TRM 10/16/06    M SVD   Y  2 TRM 10/24/05    M SVD   Y  1 PRE 03/22/04    F SVD  Y Y      Past Surgical History: Past Surgical History  Procedure Laterality Date  . Knee surgery    . Finger nail surgery    . Wisdom tooth extraction       Family History: Family History  Problem Relation Age of Onset  . Hypertension Mother   . Diabetes Mother   . Heart disease Mother   . Sickle cell trait Mother     Social History: History  Substance Use Topics  . Smoking status: Former Smoker    Types: Cigarettes    Quit date: 01/01/2013  . Smokeless tobacco: Not on file  . Alcohol Use: No    Allergies: No Known Allergies  Meds:  Prescriptions prior to admission  Medication Sig Dispense Refill  . nitrofurantoin, macrocrystal-monohydrate, (MACROBID) 100 MG capsule Take 1 capsule (100 mg total) by mouth 2 (two) times daily.  14 capsule  0  . Prenatal Vit-Fe  Fumarate-FA (PRENATAL MULTIVITAMIN) TABS tablet Take 1 tablet by mouth daily at 12 noon.        ROS: Pertinent findings in history of present illness.  Physical Exam  Last menstrual period 11/22/2012. GENERAL: Well-developed, well-nourished female in no acute distress.  HEENT: normocephalic HEART: normal rate RESP: normal effort ABDOMEN: Soft, non-tender, gravid appropriate for gestational age EXTREMITIES: Nontender, no edema NEURO: alert and oriented deep tendon reflexes 2+, no clonus.   2.5/50/-2  FHT:  Baseline 140 , moderate variability, 10 x 10 accelerations present, no decelerations Contractions: q 2-5 mins, mild   Labs: Results for Megan, [name] (MRN [id number]) as of 08/24/2013 06:27  Ref. Range 08/22/2013 22:07  Sodium Latest Range: 135-145 mEq/L 136  Potassium Latest Range: 3.5-5.1 mEq/L 2.8 (L)  Chloride Latest Range: 96-112 mEq/L 103  CO2 Latest Range: 19-32 mEq/L 19  BUN Latest Range: 6-23 mg/dL 4 (L)  Creatinine Latest Range: 0.50-1.10 mg/dL 2.95  Calcium Latest Range: 8.4-10.5 mg/dL 8.9  GFR calc non Af Amer Latest Range: >90 mL/min >90  GFR calc Af Amer Latest Range: >90 mL/min >90  Glucose Latest  Range: 70-99 mg/dL 130 (H)  Alkaline Phosphatase Latest Range: 39-117 U/L 107  Albumin Latest Range: 3.5-5.2 g/dL 2.6 (L)  AST Latest Range: 0-37 U/L 11  ALT Latest Range: 0-35 U/L 8  Total Protein Latest Range: 6.0-8.3 g/dL 6.7  Total Bilirubin Latest Range: 0.3-1.2 mg/dL 0.3  WBC Latest Range: 4.0-10.5 K/uL 11.3 (H)  RBC Latest Range: 3.87-5.11 MIL/uL 4.35  Hemoglobin Latest Range: 12.0-15.0 g/dL 86.5 (L)  HCT Latest Range: 36.0-46.0 % 29.3 (L)  MCV Latest Range: 78.0-100.0 fL 67.4 (L)  MCH Latest Range: 26.0-34.0 pg 23.0 (L)  MCHC Latest Range: 30.0-36.0 g/dL 78.4  RDW Latest Range: 11.5-15.5 % 15.1  Platelets Latest Range: 150-400 K/uL 312   Results for Megan, [name] (MRN [id number]) as of 08/24/2013 06:27  Ref. Range 08/22/2013 21:22  Color,  Urine Latest Range: YELLOW  YELLOW  APPearance Latest Range: CLEAR  CLEAR  Specific Gravity, Urine Latest Range: 1.005-1.030  1.010  pH Latest Range: 5.0-8.0  6.0  Glucose Latest Range: NEGATIVE mg/dL NEGATIVE  Bilirubin Urine Latest Range: NEGATIVE  NEGATIVE  Ketones, ur Latest Range: NEGATIVE mg/dL NEGATIVE  Protein Latest Range: NEGATIVE mg/dL NEGATIVE  Urobilinogen, UA Latest Range: 0.0-1.0 mg/dL 1.0  Nitrite Latest Range: NEGATIVE  NEGATIVE  Leukocytes, UA Latest Range: NEGATIVE  SMALL (A)  Hgb urine dipstick Latest Range: NEGATIVE  NEGATIVE  WBC, UA Latest Range: <3 WBC/hpf 7-10  RBC / HPF Latest Range: <3 RBC/hpf 0-2  Squamous Epithelial / LPF Latest Range: RARE  FEW (A)  Bacteria, UA Latest Range: RARE  FEW (A)  Total Protein, Urine No range found 14.1  PROTEIN CREATININE RATIO Latest Range: 0.00-0.15  0.15  Creatinine, Urine No range found 96.64    Imaging:  NA  MAU Course:  Assessment: Gestational hypertension versus preeclampsia  Plan: Admit for induction of labor per consult with Dr. Marvene Staff.  [place], CNM 08/22/2013 9:20 PM

## 2013-08-22 NOTE — Telephone Encounter (Signed)
Preadmission screen  

## 2013-08-23 ENCOUNTER — Inpatient Hospital Stay (HOSPITAL_COMMUNITY): Payer: Medicaid Other

## 2013-08-23 ENCOUNTER — Encounter (HOSPITAL_COMMUNITY): Payer: Medicaid Other | Admitting: Anesthesiology

## 2013-08-23 ENCOUNTER — Inpatient Hospital Stay (HOSPITAL_COMMUNITY): Payer: Medicaid Other | Admitting: Anesthesiology

## 2013-08-23 ENCOUNTER — Encounter (HOSPITAL_COMMUNITY): Payer: Self-pay

## 2013-08-23 ENCOUNTER — Inpatient Hospital Stay (HOSPITAL_COMMUNITY): Admission: RE | Admit: 2013-08-23 | Payer: Medicaid Other | Source: Ambulatory Visit

## 2013-08-23 ENCOUNTER — Encounter (HOSPITAL_COMMUNITY)
Admission: AD | Disposition: A | Discharge status: Home / Self Care | Payer: Self-pay | Source: Ambulatory Visit | Attending: Obstetrics & Gynecology

## 2013-08-23 DIAGNOSIS — O368390 Maternal care for abnormalities of the fetal heart rate or rhythm, unspecified trimester, not applicable or unspecified: Secondary | ICD-10-CM | POA: Diagnosis not present

## 2013-08-23 LAB — CBC
HCT: 28.6 % — ABNORMAL LOW (ref 36.0–46.0)
Hemoglobin: 9.9 g/dL — ABNORMAL LOW (ref 12.0–15.0)
MCH: 22.8 pg — ABNORMAL LOW (ref 26.0–34.0)
MCHC: 34.1 g/dL (ref 30.0–36.0)
MCV: 67.3 fL — ABNORMAL LOW (ref 78.0–100.0)
Platelets: 270 10*3/uL (ref 150–400)
RBC: 4.25 MIL/uL (ref 3.87–5.11)
RDW: 15.2 % (ref 11.5–15.5)
RDW: 15.2 % (ref 11.5–15.5)
WBC: 11.1 10*3/uL — ABNORMAL HIGH (ref 4.0–10.5)
WBC: 9 10*3/uL (ref 4.0–10.5)

## 2013-08-23 LAB — TYPE AND SCREEN
ABO/RH(D): B POS
Antibody Screen: NEGATIVE

## 2013-08-23 LAB — RPR: RPR Ser Ql: NONREACTIVE

## 2013-08-23 LAB — GLUCOSE, CAPILLARY: Glucose-Capillary: 106 mg/dL — ABNORMAL HIGH (ref 70–99)

## 2013-08-23 SURGERY — Surgical Case
Anesthesia: Epidural | Site: Abdomen

## 2013-08-23 MED ORDER — IBUPROFEN 600 MG PO TABS: 600.0000 mg | ORAL_TABLET | Freq: Four times a day (QID) | ORAL | Status: DC | PRN

## 2013-08-23 MED ORDER — SCOPOLAMINE 1 MG/3DAYS TD PT72
1.0000 | MEDICATED_PATCH | Freq: Once | TRANSDERMAL | Status: DC
  Administered 2013-08-24: 1.5 mg via TRANSDERMAL

## 2013-08-23 MED ORDER — OXYTOCIN BOLUS FROM INFUSION: 500.0000 mL | INTRAVENOUS | Status: DC

## 2013-08-23 MED ORDER — TERBUTALINE SULFATE 1 MG/ML IJ SOLN: 0.2500 mg | Freq: Once | INTRAMUSCULAR | Status: AC | PRN

## 2013-08-23 MED ORDER — ONDANSETRON HCL 4 MG/2ML IJ SOLN: 4.0000 mg | Freq: Four times a day (QID) | INTRAMUSCULAR | Status: DC | PRN

## 2013-08-23 MED ORDER — FENTANYL CITRATE 0.05 MG/ML IJ SOLN
25.0000 ug | INTRAMUSCULAR | Status: DC | PRN
  Administered 2013-08-24 (×3): 50 ug via INTRAVENOUS

## 2013-08-23 MED ORDER — ONDANSETRON HCL 4 MG/2ML IJ SOLN
INTRAMUSCULAR | Status: DC | PRN
  Administered 2013-08-23: 4 mg via INTRAVENOUS

## 2013-08-23 MED ORDER — FENTANYL 2.5 MCG/ML BUPIVACAINE 1/10 % EPIDURAL INFUSION (WH - ANES)
14.0000 mL/h | INTRAMUSCULAR | Status: DC | PRN
  Administered 2013-08-23 (×2): 14 mL/h via EPIDURAL
  Filled 2013-08-23 (×2): qty 125

## 2013-08-23 MED ORDER — KETOROLAC TROMETHAMINE 30 MG/ML IJ SOLN: 30.0000 mg | Freq: Four times a day (QID) | INTRAMUSCULAR | Status: DC | PRN

## 2013-08-23 MED ORDER — OXYTOCIN 40 UNITS IN LACTATED RINGERS INFUSION - SIMPLE MED: 62.5000 mL/h | INTRAVENOUS | Status: DC

## 2013-08-23 MED ORDER — ONDANSETRON HCL 4 MG/2ML IJ SOLN
INTRAMUSCULAR | Status: AC
  Filled 2013-08-23: qty 2

## 2013-08-23 MED ORDER — OXYTOCIN 40 UNITS IN LACTATED RINGERS INFUSION - SIMPLE MED
1.0000 m[IU]/min | INTRAVENOUS | Status: DC
  Administered 2013-08-23: 1 m[IU]/min via INTRAVENOUS
  Filled 2013-08-23: qty 1000

## 2013-08-23 MED ORDER — OXYTOCIN 10 UNIT/ML IJ SOLN
INTRAMUSCULAR | Status: AC
  Filled 2013-08-23: qty 4

## 2013-08-23 MED ORDER — LIDOCAINE HCL (PF) 1 % IJ SOLN
INTRAMUSCULAR | Status: DC | PRN
  Administered 2013-08-23 (×2): 5 mL

## 2013-08-23 MED ORDER — OXYCODONE-ACETAMINOPHEN 5-325 MG PO TABS: 1.0000 | ORAL_TABLET | ORAL | Status: DC | PRN

## 2013-08-23 MED ORDER — LIDOCAINE HCL (PF) 1 % IJ SOLN
30.0000 mL | INTRAMUSCULAR | Status: DC | PRN
  Filled 2013-08-23: qty 30

## 2013-08-23 MED ORDER — DEXTROSE 5 % IV SOLN
500.0000 mg | Freq: Once | INTRAVENOUS | Status: AC
  Administered 2013-08-23: 500 mg via INTRAVENOUS
  Filled 2013-08-23: qty 500

## 2013-08-23 MED ORDER — EPHEDRINE 5 MG/ML INJ
10.0000 mg | INTRAVENOUS | Status: DC | PRN
Start: 2013-08-23 — End: 2013-08-24

## 2013-08-23 MED ORDER — EPHEDRINE 5 MG/ML INJ
10.0000 mg | INTRAVENOUS | Status: DC | PRN
  Filled 2013-08-23: qty 4

## 2013-08-23 MED ORDER — PHENYLEPHRINE 40 MCG/ML (10ML) SYRINGE FOR IV PUSH (FOR BLOOD PRESSURE SUPPORT)
80.0000 ug | PREFILLED_SYRINGE | INTRAVENOUS | Status: DC | PRN
  Filled 2013-08-23: qty 10

## 2013-08-23 MED ORDER — LACTATED RINGERS IV SOLN
500.0000 mL | Freq: Once | INTRAVENOUS | Status: AC
  Administered 2013-08-23: 1000 mL via INTRAVENOUS

## 2013-08-23 MED ORDER — LIDOCAINE-EPINEPHRINE (PF) 2 %-1:200000 IJ SOLN
INTRAMUSCULAR | Status: AC
  Filled 2013-08-23: qty 20

## 2013-08-23 MED ORDER — SODIUM BICARBONATE 8.4 % IV SOLN
INTRAVENOUS | Status: AC
  Filled 2013-08-23: qty 50

## 2013-08-23 MED ORDER — PHENYLEPHRINE 40 MCG/ML (10ML) SYRINGE FOR IV PUSH (FOR BLOOD PRESSURE SUPPORT): 80.0000 ug | PREFILLED_SYRINGE | INTRAVENOUS | Status: DC | PRN

## 2013-08-23 MED ORDER — DIPHENHYDRAMINE HCL 50 MG/ML IJ SOLN
12.5000 mg | INTRAMUSCULAR | Status: DC | PRN
  Administered 2013-08-23 (×2): 12.5 mg via INTRAVENOUS
  Filled 2013-08-23: qty 1

## 2013-08-23 MED ORDER — POTASSIUM CHLORIDE CRYS ER 20 MEQ PO TBCR
20.0000 meq | EXTENDED_RELEASE_TABLET | Freq: Two times a day (BID) | ORAL | Status: DC
  Administered 2013-08-23 (×2): 20 meq via ORAL
  Filled 2013-08-23 (×3): qty 1

## 2013-08-23 MED ORDER — MEPERIDINE HCL 25 MG/ML IJ SOLN: 6.2500 mg | INTRAMUSCULAR | Status: DC | PRN

## 2013-08-23 MED ORDER — OXYTOCIN 10 UNIT/ML IJ SOLN
40.0000 [IU] | INTRAVENOUS | Status: DC | PRN
  Administered 2013-08-23: 40 [IU] via INTRAVENOUS

## 2013-08-23 MED ORDER — MORPHINE SULFATE 0.5 MG/ML IJ SOLN
INTRAMUSCULAR | Status: AC
  Filled 2013-08-23: qty 10

## 2013-08-23 MED ORDER — LACTATED RINGERS IV SOLN
INTRAVENOUS | Status: DC
  Administered 2013-08-23 (×7): via INTRAVENOUS

## 2013-08-23 MED ORDER — KETOROLAC TROMETHAMINE 30 MG/ML IJ SOLN
30.0000 mg | Freq: Four times a day (QID) | INTRAMUSCULAR | Status: DC | PRN
  Administered 2013-08-24: 30 mg via INTRAVENOUS

## 2013-08-23 MED ORDER — LACTATED RINGERS IV SOLN
500.0000 mL | INTRAVENOUS | Status: DC | PRN
  Administered 2013-08-23 (×2): 500 mL via INTRAVENOUS

## 2013-08-23 MED ORDER — ACETAMINOPHEN 325 MG PO TABS: 650.0000 mg | ORAL_TABLET | ORAL | Status: DC | PRN

## 2013-08-23 MED ORDER — CITRIC ACID-SODIUM CITRATE 334-500 MG/5ML PO SOLN
30.0000 mL | ORAL | Status: DC | PRN
  Administered 2013-08-23: 30 mL via ORAL
  Filled 2013-08-23: qty 15

## 2013-08-23 MED ORDER — SODIUM BICARBONATE 8.4 % IV SOLN
INTRAVENOUS | Status: DC | PRN
  Administered 2013-08-23 (×3): 5 mL via EPIDURAL

## 2013-08-23 SURGICAL SUPPLY — 44 items
APL SKNCLS STERI-STRIP NONHPOA (GAUZE/BANDAGES/DRESSINGS) ×2
BENZOIN TINCTURE PRP APPL 2/3 (GAUZE/BANDAGES/DRESSINGS) ×3 IMPLANT
BLADE SURG CLIPPER 3M 9600 (MISCELLANEOUS) ×2 IMPLANT
CANISTER WOUND CARE 500ML ATS (WOUND CARE) IMPLANT
CLAMP CORD UMBIL (MISCELLANEOUS) IMPLANT
CLOTH BEACON ORANGE TIMEOUT ST (SAFETY) ×3 IMPLANT
CONTAINER PREFILL 10% NBF 15ML (MISCELLANEOUS) ×4 IMPLANT
DRAPE LG THREE QUARTER DISP (DRAPES) ×2 IMPLANT
DRSG OPSITE POSTOP 4X10 (GAUZE/BANDAGES/DRESSINGS) ×3 IMPLANT
DRSG VAC ATS LRG SENSATRAC (GAUZE/BANDAGES/DRESSINGS) IMPLANT
DRSG VAC ATS MED SENSATRAC (GAUZE/BANDAGES/DRESSINGS) IMPLANT
DRSG VAC ATS SM SENSATRAC (GAUZE/BANDAGES/DRESSINGS) IMPLANT
DURAPREP 26ML APPLICATOR (WOUND CARE) ×3 IMPLANT
ELECT REM PT RETURN 9FT ADLT (ELECTROSURGICAL) ×3
ELECTRODE REM PT RTRN 9FT ADLT (ELECTROSURGICAL) ×2 IMPLANT
EXTRACTOR VACUUM M CUP 4 TUBE (SUCTIONS) IMPLANT
GLOVE BIO SURGEON STRL SZ 6.5 (GLOVE) ×3 IMPLANT
GOWN PREVENTION PLUS XLARGE (GOWN DISPOSABLE) ×6 IMPLANT
GOWN STRL REIN XL XLG (GOWN DISPOSABLE) ×6 IMPLANT
KIT ABG SYR 3ML LUER SLIP (SYRINGE) ×2 IMPLANT
NDL HYPO 25X5/8 SAFETYGLIDE (NEEDLE) ×1 IMPLANT
NEEDLE HYPO 25X5/8 SAFETYGLIDE (NEEDLE) ×3 IMPLANT
NS IRRIG 1000ML POUR BTL (IV SOLUTION) ×3 IMPLANT
PACK C SECTION WH (CUSTOM PROCEDURE TRAY) ×3 IMPLANT
PAD ABD 7.5X8 STRL (GAUZE/BANDAGES/DRESSINGS) ×2 IMPLANT
PAD OB MATERNITY 4.3X12.25 (PERSONAL CARE ITEMS) ×3 IMPLANT
RTRCTR C-SECT PINK 25CM LRG (MISCELLANEOUS) IMPLANT
SCRUB PCMX 4 OZ (MISCELLANEOUS) ×3 IMPLANT
STAPLER VISISTAT 35W (STAPLE) IMPLANT
STRIP CLOSURE SKIN 1/2X4 (GAUZE/BANDAGES/DRESSINGS) ×3 IMPLANT
SUT MNCRL 0 VIOLET CTX 36 (SUTURE) ×4 IMPLANT
SUT MNCRL AB 3-0 PS2 27 (SUTURE) ×2 IMPLANT
SUT MONOCRYL 0 CTX 36 (SUTURE) ×2
SUT PDS AB 0 CTX 36 PDP370T (SUTURE) ×3 IMPLANT
SUT PLAIN 0 NONE (SUTURE) IMPLANT
SUT VIC AB 0 CTXB 36 (SUTURE) ×4 IMPLANT
SUT VIC AB 2-0 CT1 (SUTURE) ×3 IMPLANT
SUT VIC AB 2-0 CT1 27 (SUTURE) ×3
SUT VIC AB 2-0 CT1 TAPERPNT 27 (SUTURE) ×2 IMPLANT
SUT VIC AB 2-0 SH 27 (SUTURE)
SUT VIC AB 2-0 SH 27XBRD (SUTURE) IMPLANT
TOWEL OR 17X24 6PK STRL BLUE (TOWEL DISPOSABLE) ×3 IMPLANT
TRAY FOLEY CATH 14FR (SET/KITS/TRAYS/PACK) ×1 IMPLANT
WATER STERILE IRR 1000ML POUR (IV SOLUTION) ×1 IMPLANT

## 2013-08-23 NOTE — Anesthesia Procedure Notes (Signed)
Epidural Patient location during procedure: OB Start time: 08/23/2013 12:52 PM  Staffing Anesthesiologist: Brayton Caves Performed by: anesthesiologist   Preanesthetic Checklist Completed: patient identified, site marked, surgical consent, pre-op evaluation, timeout performed, IV checked, risks and benefits discussed and monitors and equipment checked  Epidural Patient position: sitting Prep: site prepped and draped and DuraPrep Patient monitoring: continuous pulse ox and blood pressure Approach: midline Injection technique: LOR air  Needle:  Needle type: Tuohy  Needle gauge: 17 G Needle length: 9 cm and 9 Needle insertion depth: 7 cm Catheter type: closed end flexible Catheter size: 19 Gauge Catheter at skin depth: 12 cm Test dose: negative  Assessment Events: blood not aspirated, injection not painful, no injection resistance, negative IV test and no paresthesia  Additional Notes Patient identified.  Risk benefits discussed including failed block, incomplete pain control, headache, nerve damage, paralysis, blood pressure changes, nausea, vomiting, reactions to medication both toxic or allergic, and postpartum back pain.  Patient expressed understanding and wished to proceed.  All questions were answered.  Sterile technique used throughout procedure and epidural site dressed with sterile barrier dressing. No paresthesia or other complications noted.The patient did not experience any signs of intravascular injection such as tinnitus or metallic taste in mouth nor signs of intrathecal spread such as rapid motor block. Please see nursing notes for vital signs.

## 2013-08-23 NOTE — Progress Notes (Signed)
Megan [name] is a 32 y.o. [id number] at [redacted]w[redacted]d by LMP admitted for induction of labor due to gestational hypertension.  Subjective:   Objective: BP 107/85  Pulse 88  Temp(Src) 98.7 F (37.1 C) (Axillary)  Resp 20  Ht 5' 3.5" (1.613 m)  Wt 115.667 kg (255 lb)  BMI 44.46 kg/m2  SpO2 99%  LMP 11/22/2012 I/O last 3 completed shifts: In: -  Out: 250 [Urine:250] Total I/O In: -  Out: 850 [Urine:850]  FHT:  FHR: 140 bpm, variability: moderate,  accelerations:  Abscent,  decelerations:  Present prolonged UC:   irregular, every ? minutes SVE:   Dilation: 5 Effacement (%): 60 Station: -3 Exam by:: AL [employer] RN  Labs: Lab Results  Component Value Date   WBC 9.0 08/23/2013   HGB 9.7* 08/23/2013   HCT 28.6* 08/23/2013   MCV 67.3* 08/23/2013   PLT 270 08/23/2013    Assessment / Plan: Early labor  Labor: see above Preeclampsia:  B/Ps stable Fetal Wellbeing:  Category II Pain Control:  Epidural I/D:  n/a Anticipated MOD:  C/D with BTL.  Informed consent obtained.  JACKSON-MOORE,Kataleya Zaugg A 08/23/2013, 11:00 PM

## 2013-08-23 NOTE — H&P (Addendum)
Megan [name] is [name] 32 y.o. female presenting for contractions. Maternal Medical History:  Reason for admission: Contractions.   Contractions: Onset was 6-12 hours ago.    Fetal activity: Perceived fetal activity is normal.    Prenatal complications: Gestational HTN, UTI, quad screen: screen positive for trisomy 21  Prenatal Complications - Diabetes: none.    OB History   Grav Para Term Preterm Abortions TAB SAB Ect Mult Living   5 4 3 1      4      Past Medical History  Diagnosis Date  . Obese   . Thyroid disease   . Sickle cell trait   . Anemia    Past Surgical History  Procedure Laterality Date  . Knee surgery    . Finger nail surgery    . Wisdom tooth extraction     Family History: family history includes Diabetes in her mother; Heart disease in her mother; Hypertension in her mother; Sickle cell trait in her mother. Social History:  reports that she quit smoking about 7 months ago. Her smoking use included Cigarettes. She smoked 0.00 packs per day. She has never used smokeless tobacco. She reports that she does not drink alcohol or use illicit drugs.     Review of Systems  Constitutional: Negative for fever.  Eyes: Negative for blurred vision.  Respiratory: Negative for shortness of breath.   Gastrointestinal: Negative for vomiting.  Skin: Negative for rash.  Neurological: Negative for headaches.    Dilation: 4 Effacement (%): 60 Station: -3 Exam by:: L.STUBBS, RN Blood pressure 137/89, pulse 79, temperature 98.4 F (36.9 C), temperature source Oral, resp. rate 18, height 5' 3.5" (1.613 m), weight 115.667 kg (255 lb), last menstrual period 11/22/2012. Maternal Exam:  Uterine Assessment: Contraction strength is mild.  Contraction frequency is irregular.   Abdomen: not evaluated.  Introitus: not evaluated.   Cervix: Cervix evaluated by digital exam.     Fetal Exam Fetal Monitor Review: Variability: moderate (6-25 bpm).   Pattern: accelerations  present.    Fetal State Assessment: Category I - tracings are normal.     Physical Exam  Constitutional: She appears well-developed.  HENT:  Head: Normocephalic.  Neck: Neck supple. No thyromegaly present.  Cardiovascular: Normal rate and regular rhythm.   Respiratory: Breath sounds normal.  GI: Soft. Bowel sounds are normal.  Skin: No rash noted.    Prenatal labs: ABO, Rh: --/--/B POS (12/26 2207) Antibody: NEG (12/26 2207) Rubella: 1.08 (06/25 1321) RPR: NON REAC (09/10 1207)  HBsAg: NEGATIVE (06/25 1321)  HIV: NON REACTIVE (09/10 1207)  GBS: NEGATIVE (12/11 1358)   Assessment/Plan: Multipara @ [redacted]w[redacted]d.  Pregancy complicated by gestational hypertension.   Early labor.  Hypokalemia. Category I FHT.  Admit Replace potassium Low dose Pitocin per protocol Anticipate an NSVD   JACKSON-MOORE,Megan [name] 08/23/2013, 9:30 AM

## 2013-08-23 NOTE — Anesthesia Preprocedure Evaluation (Addendum)
Anesthesia Evaluation  Patient identified by MRN, date of birth, ID band Patient awake    Reviewed: Allergy & Precautions, H&P , NPO status , Patient's Chart, lab work & pertinent test results  Airway Mallampati: III TM Distance: >3 FB Neck ROM: full    Dental   Pulmonary former smoker,  breath sounds clear to auscultation  Pulmonary exam normal       Cardiovascular hypertension, negative cardio ROS  Rhythm:regular Rate:Normal     Neuro/Psych negative neurological ROS  negative psych ROS   GI/Hepatic negative GI ROS, Neg liver ROS,   Endo/Other  Morbid obesity  Renal/GU negative Renal ROS  negative genitourinary   Musculoskeletal negative musculoskeletal ROS (+)   Abdominal Normal abdominal exam  (+) + obese,   Peds  Hematology  (+) anemia ,   Anesthesia Other Findings   Reproductive/Obstetrics (+) Pregnancy                          Anesthesia Physical Anesthesia Plan  ASA: III and emergent  Anesthesia Plan: Epidural   Post-op Pain Management:    Induction:   Airway Management Planned: Natural Airway  Additional Equipment:   Intra-op Plan:   Post-operative Plan:   Informed Consent: I have reviewed the patients History and Physical, chart, labs and discussed the procedure including the risks, benefits and alternatives for the proposed anesthesia with the patient or authorized representative who has indicated his/her understanding and acceptance.     Plan Discussed with: Anesthesiologist, CRNA and Surgeon  Anesthesia Plan Comments: (Patient for C/Section for arrest of descent and fetal intolerance to labor. Will use epidural for C/S. )       Anesthesia Quick Evaluation

## 2013-08-24 ENCOUNTER — Encounter (HOSPITAL_COMMUNITY): Payer: Self-pay | Admitting: *Deleted

## 2013-08-24 DIAGNOSIS — IMO0002 Reserved for concepts with insufficient information to code with codable children: Secondary | ICD-10-CM | POA: Diagnosis present

## 2013-08-24 DIAGNOSIS — R8271 Bacteriuria: Secondary | ICD-10-CM | POA: Diagnosis present

## 2013-08-24 LAB — URINE CULTURE: Colony Count: >=100000

## 2013-08-24 LAB — CBC
MCH: 22.8 pg — ABNORMAL LOW (ref 26.0–34.0)
Platelets: 260 10*3/uL (ref 150–400)
RBC: 4.08 MIL/uL (ref 3.87–5.11)
RDW: 15.1 % (ref 11.5–15.5)
WBC: 13.1 10*3/uL — ABNORMAL HIGH (ref 4.0–10.5)

## 2013-08-24 LAB — GLUCOSE, CAPILLARY: Glucose-Capillary: 128 mg/dL — ABNORMAL HIGH (ref 70–99)

## 2013-08-24 MED ORDER — FENTANYL CITRATE 0.05 MG/ML IJ SOLN
INTRAMUSCULAR | Status: AC
  Filled 2013-08-24: qty 2

## 2013-08-24 MED ORDER — ONDANSETRON HCL 4 MG/2ML IJ SOLN: 4.0000 mg | Freq: Three times a day (TID) | INTRAMUSCULAR | Status: DC | PRN

## 2013-08-24 MED ORDER — IBUPROFEN 600 MG PO TABS
600.0000 mg | ORAL_TABLET | Freq: Four times a day (QID) | ORAL | Status: DC
  Administered 2013-08-24 – 2013-08-27 (×12): 600 mg via ORAL
  Filled 2013-08-24 (×12): qty 1

## 2013-08-24 MED ORDER — DIPHENHYDRAMINE HCL 50 MG/ML IJ SOLN: 25.0000 mg | INTRAMUSCULAR | Status: DC | PRN

## 2013-08-24 MED ORDER — NALOXONE HCL 0.4 MG/ML IJ SOLN: 0.4000 mg | INTRAMUSCULAR | Status: DC | PRN

## 2013-08-24 MED ORDER — PRENATAL MULTIVITAMIN CH
1.0000 | ORAL_TABLET | Freq: Every day | ORAL | Status: DC
  Administered 2013-08-24 – 2013-08-26 (×3): 1 via ORAL
  Filled 2013-08-24 (×3): qty 1

## 2013-08-24 MED ORDER — ONDANSETRON HCL 4 MG/2ML IJ SOLN: 4.0000 mg | INTRAMUSCULAR | Status: DC | PRN

## 2013-08-24 MED ORDER — KETOROLAC TROMETHAMINE 30 MG/ML IJ SOLN
INTRAMUSCULAR | Status: AC
  Filled 2013-08-24: qty 1

## 2013-08-24 MED ORDER — LACTATED RINGERS IV SOLN: INTRAVENOUS | Status: DC

## 2013-08-24 MED ORDER — NALBUPHINE SYRINGE 5 MG/0.5 ML
5.0000 mg | INJECTION | INTRAMUSCULAR | Status: DC | PRN
  Filled 2013-08-24: qty 1

## 2013-08-24 MED ORDER — DIPHENHYDRAMINE HCL 25 MG PO CAPS
25.0000 mg | ORAL_CAPSULE | Freq: Four times a day (QID) | ORAL | Status: DC | PRN
  Filled 2013-08-24: qty 1

## 2013-08-24 MED ORDER — TETANUS-DIPHTH-ACELL PERTUSSIS 5-2.5-18.5 LF-MCG/0.5 IM SUSP
0.5000 mL | Freq: Once | INTRAMUSCULAR | Status: AC
  Administered 2013-08-24: 0.5 mL via INTRAMUSCULAR
  Filled 2013-08-24: qty 0.5

## 2013-08-24 MED ORDER — FENTANYL CITRATE 0.05 MG/ML IJ SOLN
INTRAMUSCULAR | Status: AC
  Administered 2013-08-24: 50 ug via INTRAVENOUS
  Filled 2013-08-24: qty 4

## 2013-08-24 MED ORDER — SIMETHICONE 80 MG PO CHEW
80.0000 mg | CHEWABLE_TABLET | ORAL | Status: DC | PRN
  Administered 2013-08-26: 80 mg via ORAL
  Filled 2013-08-24: qty 1

## 2013-08-24 MED ORDER — DIPHENHYDRAMINE HCL 50 MG/ML IJ SOLN
12.5000 mg | INTRAMUSCULAR | Status: DC | PRN
  Administered 2013-08-24: 09:00:00 via INTRAVENOUS
  Filled 2013-08-24: qty 1

## 2013-08-24 MED ORDER — WITCH HAZEL-GLYCERIN EX PADS: 1.0000 "application " | MEDICATED_PAD | CUTANEOUS | Status: DC | PRN

## 2013-08-24 MED ORDER — INFLUENZA VAC SPLIT QUAD 0.5 ML IM SUSP: 0.5000 mL | INTRAMUSCULAR | Status: DC

## 2013-08-24 MED ORDER — SIMETHICONE 80 MG PO CHEW
80.0000 mg | CHEWABLE_TABLET | ORAL | Status: DC
  Administered 2013-08-25 – 2013-08-26 (×3): 80 mg via ORAL
  Filled 2013-08-24 (×3): qty 1

## 2013-08-24 MED ORDER — SCOPOLAMINE 1 MG/3DAYS TD PT72
MEDICATED_PATCH | TRANSDERMAL | Status: AC
  Administered 2013-08-24: 1.5 mg via TRANSDERMAL
  Filled 2013-08-24: qty 1

## 2013-08-24 MED ORDER — LANOLIN HYDROUS EX OINT: 1.0000 "application " | TOPICAL_OINTMENT | CUTANEOUS | Status: DC | PRN

## 2013-08-24 MED ORDER — ONDANSETRON HCL 4 MG PO TABS: 4.0000 mg | ORAL_TABLET | ORAL | Status: DC | PRN

## 2013-08-24 MED ORDER — OXYCODONE-ACETAMINOPHEN 5-325 MG PO TABS
1.0000 | ORAL_TABLET | ORAL | Status: DC | PRN
  Administered 2013-08-24 (×2): 1 via ORAL
  Administered 2013-08-25 (×2): 2 via ORAL
  Administered 2013-08-25: 1 via ORAL
  Administered 2013-08-25 – 2013-08-26 (×2): 2 via ORAL
  Administered 2013-08-26: 1 via ORAL
  Administered 2013-08-26 – 2013-08-27 (×3): 2 via ORAL
  Filled 2013-08-24 (×4): qty 2
  Filled 2013-08-24: qty 1
  Filled 2013-08-24 (×2): qty 2
  Filled 2013-08-24 (×3): qty 1
  Filled 2013-08-24: qty 2

## 2013-08-24 MED ORDER — INFLUENZA VAC SPLIT QUAD 0.5 ML IM SUSP
0.5000 mL | Freq: Once | INTRAMUSCULAR | Status: AC
  Administered 2013-08-24: 0.5 mL via INTRAMUSCULAR
  Filled 2013-08-24: qty 0.5

## 2013-08-24 MED ORDER — DIPHENHYDRAMINE HCL 25 MG PO CAPS
25.0000 mg | ORAL_CAPSULE | ORAL | Status: DC | PRN
  Administered 2013-08-24 – 2013-08-25 (×5): 25 mg via ORAL
  Filled 2013-08-24 (×4): qty 1

## 2013-08-24 MED ORDER — DEXTROSE 5 % IV SOLN
3.0000 g | INTRAVENOUS | Status: DC | PRN
  Administered 2013-08-24: 3 g via INTRAVENOUS

## 2013-08-24 MED ORDER — DEXTROSE 5 % IV SOLN: 3.0000 g | INTRAVENOUS | Status: DC

## 2013-08-24 MED ORDER — DEXTROSE 5 % IV SOLN
1.0000 ug/kg/h | INTRAVENOUS | Status: DC | PRN
  Filled 2013-08-24: qty 2

## 2013-08-24 MED ORDER — MORPHINE SULFATE (PF) 0.5 MG/ML IJ SOLN
INTRAMUSCULAR | Status: DC | PRN
  Administered 2013-08-24: 1 mg via INTRAVENOUS

## 2013-08-24 MED ORDER — FERROUS SULFATE 325 (65 FE) MG PO TABS
325.0000 mg | ORAL_TABLET | Freq: Two times a day (BID) | ORAL | Status: DC
  Administered 2013-08-24 – 2013-08-27 (×7): 325 mg via ORAL
  Filled 2013-08-24 (×7): qty 1

## 2013-08-24 MED ORDER — MEASLES, MUMPS & RUBELLA VAC ~~LOC~~ INJ
0.5000 mL | INJECTION | Freq: Once | SUBCUTANEOUS | Status: DC
  Filled 2013-08-24: qty 0.5

## 2013-08-24 MED ORDER — METOCLOPRAMIDE HCL 5 MG/ML IJ SOLN: 10.0000 mg | Freq: Three times a day (TID) | INTRAMUSCULAR | Status: DC | PRN

## 2013-08-24 MED ORDER — SENNOSIDES-DOCUSATE SODIUM 8.6-50 MG PO TABS
2.0000 | ORAL_TABLET | ORAL | Status: DC
  Administered 2013-08-25 – 2013-08-26 (×3): 2 via ORAL
  Filled 2013-08-24 (×3): qty 2

## 2013-08-24 MED ORDER — OXYTOCIN 40 UNITS IN LACTATED RINGERS INFUSION - SIMPLE MED: 62.5000 mL/h | INTRAVENOUS | Status: DC

## 2013-08-24 MED ORDER — MAGNESIUM HYDROXIDE 400 MG/5ML PO SUSP: 30.0000 mL | ORAL | Status: DC | PRN

## 2013-08-24 MED ORDER — ZOLPIDEM TARTRATE 5 MG PO TABS: 5.0000 mg | ORAL_TABLET | Freq: Every evening | ORAL | Status: DC | PRN

## 2013-08-24 MED ORDER — FENTANYL CITRATE 0.05 MG/ML IJ SOLN
INTRAMUSCULAR | Status: DC | PRN
  Administered 2013-08-24 (×2): 50 ug via INTRAVENOUS

## 2013-08-24 MED ORDER — DEXTROSE 5 % IV SOLN
INTRAVENOUS | Status: AC
  Filled 2013-08-24: qty 3000

## 2013-08-24 MED ORDER — NALBUPHINE SYRINGE 5 MG/0.5 ML
5.0000 mg | INJECTION | INTRAMUSCULAR | Status: DC | PRN
  Administered 2013-08-24: 5 mg via INTRAVENOUS
  Filled 2013-08-24: qty 1

## 2013-08-24 MED ORDER — DIBUCAINE 1 % RE OINT: 1.0000 "application " | TOPICAL_OINTMENT | RECTAL | Status: DC | PRN

## 2013-08-24 MED ORDER — 0.9 % SODIUM CHLORIDE (POUR BTL) OPTIME
TOPICAL | Status: DC | PRN
  Administered 2013-08-24: 1000 mL

## 2013-08-24 MED ORDER — SODIUM CHLORIDE 0.9 % IJ SOLN: 3.0000 mL | INTRAMUSCULAR | Status: DC | PRN

## 2013-08-24 MED ORDER — MORPHINE SULFATE (PF) 0.5 MG/ML IJ SOLN
INTRAMUSCULAR | Status: DC | PRN
  Administered 2013-08-24: 4 mg via EPIDURAL

## 2013-08-24 NOTE — Anesthesia Postprocedure Evaluation (Signed)
  Anesthesia Post-op Note  Patient: [name]  Procedure(s) Performed: Procedure(s): Primary Cesarean Section Delivery Baby Girl @ 2347, Apgars 8/9, Bilateral Tubal Ligation (N/A)  Patient Location: PACU  Anesthesia Type:Epidural  Level of Consciousness: awake, alert  and oriented  Airway and Oxygen Therapy: Patient Spontanous Breathing  Post-op Pain: none  Post-op Assessment: Post-op Vital signs reviewed, Patient's Cardiovascular Status Stable, Respiratory Function Stable, Patent Airway, No signs of Nausea or vomiting, Pain level controlled, No headache and No backache  Post-op Vital Signs: Reviewed and stable  Complications: No apparent anesthesia complications

## 2013-08-24 NOTE — Plan of Care (Signed)
Problem: Consults Goal: Skin Care Protocol Initiated - if Braden Score 18 or less If consults are not indicated, leave blank or document N/A  Outcome: Not Applicable Date Met:  08/24/13 Patient moves a lot in bed  Problem: Phase I Progression Outcomes Goal: Foley catheter patent Outcome: Completed/Met Date Met:  08/24/13 Draining qs amt of clear yellow urine

## 2013-08-24 NOTE — Transfer of Care (Signed)
Immediate Anesthesia Transfer of Care Note  Patient: Megan [name]  Procedure(s) Performed: Procedure(s): Primary Cesarean Section Delivery Baby Girl @ 2347, Apgars 8/9, Bilateral Tubal Ligation (N/A)  Patient Location: PACU  Anesthesia Type:Epidural  Level of Consciousness: awake  Airway & Oxygen Therapy: Patient Spontanous Breathing  Post-op Assessment: Report given to PACU RN and Post -op Vital signs reviewed and stable  Post vital signs: stable  Complications: No apparent anesthesia complications

## 2013-08-24 NOTE — Op Note (Signed)
Cesarean Section Procedure Note   Megan [name]   08/22/2013 - 08/24/2013  Indications: Fetal heart tracing with repetitive, prolonged decelerations, Category II tracing   Pre-operative Diagnosis: Fetal heart tracing with repetitive, prolonged decelerations, Category II tracing  Desires Sterilization.   Post-operative Diagnosis: Same   Surgeon: Antionette Char A  Assistants: none  Anesthesia: epidural  Procedure Details:  The patient was seen in the Holding Room. The risks, benefits, complications, treatment options, and expected outcomes were discussed with the patient. The patient concurred with the proposed plan, giving informed consent. The patient was identified as Megan [name] and the procedure verified as C-Section Delivery. A Time Out was held and the above information confirmed.  After induction of anesthesia, the patient was draped and prepped in the usual sterile manner. A transverse incision was made and carried down through the subcutaneous tissue to the fascia. The fascial incision was made and extended transversely. The fascia was separated from the underlying rectus tissue superiorly. The peritoneum was identified and entered. The peritoneal incision was extended longitudinally. The utero-vesical peritoneal reflection was incised transversely and the bladder flap was bluntly freed from the lower uterine segment. A low transverse uterine incision was made. Delivered from cephalic presentation was a  living newborn female infant. APGAR (1 MIN): 8   APGAR (5 MINS): 9   A cord ph was sent. The umbilical cord was clamped and cut cord. A sample was obtained for evaluation. The placenta was removed Intact and appeared normal.  The uterine incision was closed with running locked sutures of 1-0 Monocryl.   Hemostasis was observed. The right fallopian tube was identified and followed out to the fimbriated end.  The midisthmic portion of the tube was grasped with a Babcock  clamp.  A 1 - 2 cm segment of tube doubly ligated with 0-Plain sutures and transected.  The left fallopian tube was manipulated in a similar fashion.  The paracolic gutters were irrigated. The parieto peritoneum was closed in a running fashion with 2-0 Vicryl.  The fascia was then reapproximated with running sutures of 0 Vicryl.  The skin was closed with suture.  Instrument, sponge, and needle counts were correct prior the abdominal closure and were correct at the conclusion of the case.    Findings:  See above   Estimated Blood Loss: 800 ml  Total IV Fluids: per Anesthesiology  Urine Output: per Anesthesiology  Specimens: Placenta to Pathology  Complications: no complications  Disposition: PACU - hemodynamically stable.  Maternal Condition: stable   Baby condition / location:  Couplet care / Skin to Skin    Signed: Surgeon(s): Antionette Char, MD

## 2013-08-24 NOTE — Lactation Note (Signed)
This note was copied from the chart of Megan [name]. Lactation Consultation Note: initial visit with this experienced BF mom. Mom reports that baby has been nursing well. Baby asleep in visitors arms. No questions at present. BF brochure given with resources for support after DC. To call for assist prn  Patient Name: Megan [name] [id number] Date: 08/24/2013 Reason for consult: Initial assessment   Maternal Data Formula Feeding for Exclusion: No Infant to breast within first hour of birth: Yes Does the patient have breastfeeding experience prior to this delivery?: Yes  Feeding    LATCH Score/Interventions                      Lactation Tools Discussed/Used     Consult Status Consult Status: PRN    Pamelia Hoit 08/24/2013, 3:00 PM

## 2013-08-24 NOTE — Plan of Care (Signed)
Problem: Phase II Progression Outcomes Goal: Progress activity as tolerated unless otherwise ordered Outcome: Completed/Met Date Met:  08/24/13 Walks in room and hallway Goal: Incision intact & without signs/symptoms of infection Outcome: Completed/Met Date Met:  08/24/13 Old drsg removed earlier due to a lot of old d/c.Drsg now CDI.Incision care discussed with patient. Goal: Tolerating diet Outcome: Completed/Met Date Met:  08/24/13 Tolerates a regulra diet well.  Problem: Discharge Progression Outcomes Goal: Remove staples per MD order Outcome: Not Applicable Date Met:  08/24/13 Patient has sutures in place.Incision care and what to cll the MD for discussed with patient.

## 2013-08-24 NOTE — Anesthesia Postprocedure Evaluation (Signed)
Anesthesia Post Note  Patient: Megan [name]  Procedure(s) Performed: Procedure(s) (LRB): Primary Cesarean Section Delivery Baby Girl @ 2347, Apgars 8/9, Bilateral Tubal Ligation (N/A)  Anesthesia type: Epidural  Patient location: Mother/Baby  Post pain: Pain level controlled  Post assessment: Post-op Vital signs reviewed  Last Vitals:  Filed Vitals:   08/24/13 0830  BP: 114/71  Pulse: 102  Temp: 37.3 C  Resp: 20    Post vital signs: Reviewed  Level of consciousness:alert  Complications: No apparent anesthesia complications

## 2013-08-24 NOTE — Plan of Care (Signed)
Problem: Phase II Progression Outcomes Goal: Afebrile, VS remain stable Outcome: Completed/Met Date Met:  08/24/13 Pulse is a little Tachy at times. Goal: Other Phase II Outcomes/Goals Outcome: Completed/Met Date Met:  08/24/13 Has now voided since Foley was removed

## 2013-08-24 NOTE — Progress Notes (Signed)
Patient ID: Megan [name], female   DOB: 1980/12/05, 32 y.o.   MRN: [id number] Subjective: POD# 0 s/p Cesarean Delivery.  Indications: suspicious FHT  RH status/Rubella reviewed. Feeding: unknown Patient reports tolerating PO.  Denies HA/SOB/C/P/N/V/dizziness.  Breast symptoms: no.  She reports vaginal bleeding as normal, without clots.  She is ambulating.     Objective: Vital signs in last 24 hours: BP 114/71  Pulse 102  Temp(Src) 99.2 F (37.3 C) (Oral)  Resp 20  Ht 5' 3.5" (1.613 m)  Wt 115.667 kg (255 lb)  BMI 44.46 kg/m2  SpO2 99%  LMP 11/22/2012       Physical Exam:  General: alert CV: Regular rate and rhythm Resp: clear Abdomen: soft, nontender, normal bowel sounds Lochia: minimal Uterine Fundus: firm, below umbilicus, nontender Incision: bloody drainage present Ext: extremities normal, atraumatic, no cyanosis or edema    Recent Labs  08/23/13 1148 08/24/13 0506  HGB 9.7* 9.3*  HCT 28.6* 27.6*      Assessment/Plan: 32 y.o.  status post Cesarean section. POD# 0.  B/Ps in range.   Doing well, stable.              Advance diet as tolerated Start po pain meds D/C foley  HLIV  Ambulate IS Routine post-op care  JACKSON-MOORE,Megan [name] 08/24/2013, 11:17 AM

## 2013-08-25 ENCOUNTER — Encounter (HOSPITAL_COMMUNITY): Payer: Self-pay | Admitting: Obstetrics & Gynecology

## 2013-08-25 NOTE — Progress Notes (Signed)
UR completed 

## 2013-08-25 NOTE — Progress Notes (Signed)
Patient ID: Megan [name], female   DOB: [birth year], 32 y.o.   MRN: [id number] Subjective: POD# 1 s/p Cesarean Delivery.  Indications: suspicious FHT  RH status/Rubella reviewed. Feeding: unknown Patient reports tolerating PO.  Denies HA/SOB/C/P/N/V/dizziness.  Breast symptoms: no.  She reports vaginal bleeding as normal, without clots.  She is ambulating.     Objective: Vital signs in last 24 hours: BP 121/60  Pulse 90  Temp(Src) 98.2 F (36.8 C) (Oral)  Resp 22  Ht 5' 3.5" (1.613 m)  Wt 115.667 kg (255 lb)  BMI 44.46 kg/m2  SpO2 100%  LMP 11/22/2012       Physical Exam:  General: alert CV: Regular rate and rhythm Resp: clear Abdomen: soft, nontender, normal bowel sounds Lochia: minimal Uterine Fundus: firm, below umbilicus, nontender Incision: bloody drainage present Ext: extremities normal, atraumatic, no cyanosis or edema    Assessment/Plan: 32 y.o.  status post Cesarean section. POD# 1.  B/Ps in range.   Doing well, stable.               Ambulate IS Routine post-op care  JACKSON-MOORE,Atticus Wedin A 08/25/2013, 7:58 AM

## 2013-08-27 MED ORDER — FUSION PLUS PO CAPS: 1.0000 | ORAL_CAPSULE | Freq: Every day | ORAL | Status: DC

## 2013-08-27 MED ORDER — OXYCODONE-ACETAMINOPHEN 5-325 MG PO TABS: 1.0000 | ORAL_TABLET | ORAL | Status: DC | PRN

## 2013-08-27 MED ORDER — IBUPROFEN 600 MG PO TABS: 600.0000 mg | ORAL_TABLET | Freq: Four times a day (QID) | ORAL | Status: DC | PRN

## 2013-08-27 NOTE — Progress Notes (Signed)
Subjective: Postpartum Day 3: Cesarean Delivery Patient reports tolerating PO, + flatus, + BM and no problems voiding.    Objective: Vital signs in last 24 hours: Temp:  [98.2 F (36.8 C)-98.7 F (37.1 C)] 98.2 F (36.8 C) (12/30 2254) Pulse Rate:  [97-106] 97 (12/30 2254) Resp:  [18-20] 18 (12/30 2254) BP: (114-129)/(64-77) 129/70 mmHg (12/30 2254) SpO2:  [98 %-100 %] 100 % (12/30 2254)  Physical Exam:  General: alert and no distress Lochia: appropriate Uterine Fundus: firm Incision: healing well DVT Evaluation: No evidence of DVT seen on physical exam.   Recent Labs  08/24/13 0506  HGB 9.3*  HCT 27.6*    Assessment/Plan: Status post Cesarean section. Doing well postoperatively.  Discharge home with standard precautions and return to clinic in 2 weeks.  Meah Jiron A 08/27/2013, 5:02 AM

## 2013-08-27 NOTE — Discharge Summary (Signed)
Obstetric Discharge Summary Reason for Admission: onset of labor Prenatal Procedures: ultrasound Intrapartum Procedures: cesarean: low cervical, transverse Postpartum Procedures: none Complications-Operative and Postpartum: none Hemoglobin  Date Value Range Status  08/24/2013 9.3* 12.0 - 15.0 g/dL Final     HCT  Date Value Range Status  08/24/2013 27.6* 36.0 - 46.0 % Final    Physical Exam:  General: alert and no distress Lochia: appropriate Uterine Fundus: firm Incision: healing well DVT Evaluation: No evidence of DVT seen on physical exam.  Discharge Diagnoses: Term Pregnancy-delivered  Discharge Information: Date: 08/27/2013 Activity: pelvic rest Diet: routine Medications: PNV, Ibuprofen, Colace, Iron and Percocet Condition: stable Instructions: refer to practice specific booklet Discharge to: home Follow-up Information   Follow up with Antionette Char A, MD. Schedule an appointment as soon as possible for a visit in 2 weeks.   Specialty:  Obstetrics and Gynecology   Contact information:   [address] [place] [address] [phone]       Newborn Data: Live born female  Birth Weight: 6 lb 11.1 oz (3035 g) APGAR: 8, 9  Home with mother.  Mareon Robinette A 08/27/2013, 5:09 AM

## 2013-09-11 ENCOUNTER — Ambulatory Visit: Payer: Medicaid Other | Admitting: Obstetrics

## 2014-06-29 ENCOUNTER — Encounter (HOSPITAL_COMMUNITY): Payer: Self-pay | Admitting: Obstetrics & Gynecology

## 2014-08-24 ENCOUNTER — Encounter: Payer: Self-pay | Admitting: *Deleted

## 2014-08-25 ENCOUNTER — Encounter: Payer: Self-pay | Admitting: Obstetrics & Gynecology

## 2015-02-17 ENCOUNTER — Encounter (HOSPITAL_COMMUNITY): Payer: Self-pay | Admitting: Emergency Medicine

## 2015-02-17 ENCOUNTER — Emergency Department (HOSPITAL_COMMUNITY)
Admission: EM | Admit: 2015-02-17 | Discharge: 2015-02-17 | Disposition: A | Discharge status: Home / Self Care | Payer: Medicaid Other | Attending: Emergency Medicine | Admitting: Emergency Medicine

## 2015-02-17 DIAGNOSIS — Z87891 Personal history of nicotine dependence: Secondary | ICD-10-CM | POA: Diagnosis not present

## 2015-02-17 DIAGNOSIS — W57XXXA Bitten or stung by nonvenomous insect and other nonvenomous arthropods, initial encounter: Secondary | ICD-10-CM | POA: Diagnosis not present

## 2015-02-17 DIAGNOSIS — E669 Obesity, unspecified: Secondary | ICD-10-CM | POA: Diagnosis not present

## 2015-02-17 DIAGNOSIS — Y939 Activity, unspecified: Secondary | ICD-10-CM | POA: Insufficient documentation

## 2015-02-17 DIAGNOSIS — S40862A Insect bite (nonvenomous) of left upper arm, initial encounter: Secondary | ICD-10-CM | POA: Insufficient documentation

## 2015-02-17 DIAGNOSIS — Z79899 Other long term (current) drug therapy: Secondary | ICD-10-CM | POA: Insufficient documentation

## 2015-02-17 DIAGNOSIS — Y929 Unspecified place or not applicable: Secondary | ICD-10-CM | POA: Insufficient documentation

## 2015-02-17 DIAGNOSIS — Y999 Unspecified external cause status: Secondary | ICD-10-CM | POA: Diagnosis not present

## 2015-02-17 DIAGNOSIS — Z8639 Personal history of other endocrine, nutritional and metabolic disease: Secondary | ICD-10-CM | POA: Diagnosis not present

## 2015-02-17 DIAGNOSIS — S40861A Insect bite (nonvenomous) of right upper arm, initial encounter: Secondary | ICD-10-CM | POA: Diagnosis present

## 2015-02-17 DIAGNOSIS — D649 Anemia, unspecified: Secondary | ICD-10-CM | POA: Insufficient documentation

## 2015-02-17 NOTE — Discharge Instructions (Signed)
Bedbugs °Bedbugs are tiny bugs that live in and around beds. During the day, they hide in mattresses and other places near beds. They come out at night and bite people lying in bed. They need blood to live and grow. Bedbugs can be found in beds anywhere. Usually, they are found in places where many people come and go (hotels, shelters, hospitals). It does not matter whether the place is dirty or clean. °Getting bitten by bedbugs rarely causes a medical problem. The biggest problem can be getting rid of them.  This often takes the work of a pest control expert. °CAUSES °· Less use of pesticides. Bedbugs were common before the 1950s. Then, strong pesticides such as DDT nearly wiped them out. Today, these pesticides are not used because they harm the environment and can cause health problems. °· More travel. Besides mattresses, bedbugs can also live in clothing and luggage. They can come along as people travel from place to place. Bedbugs are more common in certain parts of the world. When people travel to those areas, the bugs can come home with them. °· Presence of birds and bats. Bedbugs often infest birds and bats. If you have these animals in or near your home, bedbugs may infest your house, too. °SYMPTOMS °It does not hurt to be bitten by a bedbug. You will probably not wake up when you are bitten. Bedbugs usually bite areas of the skin that are not covered. Symptoms may show when you wake up, or they may take a day or more to show up. Symptoms may include: °· Small red bumps on the skin. These might be lined up in a row or clustered in a group. °· A darker red dot in the middle of red bumps. °· Blisters on the skin. There may be swelling and very bad itching. These may be signs of an allergic reaction. This does not happen often. °DIAGNOSIS °Bedbug bites might look and feel like other types of insect bites. The bugs do not stay on the body like ticks or lice. They bite, drop off, and crawl away to hide. Your  caregiver will probably: °· Ask about your symptoms. °· Ask about your recent activities and travel. °· Check your skin for bedbug bites. °· Ask you to check at home for signs of bedbugs. You should look for: °¨ Spots or stains on the bed or nearby. This could be from bedbugs that were crushed or from their eggs or waste. °¨ Bedbugs themselves. They are reddish-brown, oval, and flat. They do not fly. They are about the size of an apple seed. °· Places to look for bedbugs include: °¨ Beds. Check mattresses, headboards, box springs, and bed frames. °¨ On drapes and curtains near the bed. °¨ Under carpeting in the bedroom. °¨ Behind electrical outlets. °¨ Behind any wallpaper that is peeling. °¨ Inside luggage. °TREATMENT °Most bedbug bites do not need treatment. They usually go away on their own in a few days. The bites are not dangerous. However, treatment may be needed if you have scratched so much that your skin has become infected. You may also need treatment if you are allergic to bedbug bites. Treatment options include: °· A drug that stops swelling and itching (corticosteroid). Usually, a cream is rubbed on the skin. If you have a bad rash, you may be given a corticosteroid pill. °· Oral antihistamines. These are pills to help control itching. °· Antibiotic medicines. An antibiotic may be prescribed for infected skin. °HOME CARE INSTRUCTIONS  °·   Take any medicine prescribed by your caregiver for your bites. Follow the directions carefully.  Consider wearing pajamas with long sleeves and pant legs.  Your bedroom may need to be treated. A pest control expert should make sure the bedbugs are gone. You may need to throw away mattresses or luggage. Ask the pest control expert what you can do to keep the bedbugs from coming back. Common suggestions include:  Putting a plastic cover over your mattress.  Washing and drying your clothes and bedding in hot water and a hot dryer. The temperature should be hotter  than 120 F (48.9 C). Bedbugs are killed by high temperatures.  Vacuuming carefully all around your bed. Vacuum in all cracks and crevices where the bugs might hide. Do this often.  Carefully checking all used furniture, bedding, or clothes that you bring into your house.  Eliminating bird nests and bat roosts.  If you get bedbug bites when traveling, check all your possessions carefully before bringing them into your house. If you find any bugs on clothes or in your luggage, consider throwing those items away. SEEK MEDICAL CARE IF:  You have red bug bites that keep coming back.  You have red bug bites that itch badly.  You have bug bites that cause a skin rash.  You have scratch marks that are red and sore. SEEK IMMEDIATE MEDICAL CARE IF: You have a fever. Document Released: 09/16/2010 Document Revised: 11/06/2011 Document Reviewed: 09/16/2010 [facility] Patient Information 2015 [place], [place]. This information is not intended to replace advice given to you by your health care provider. Make sure you discuss any questions you have with your health care provider.    bleach everything in hot water and exterminate

## 2015-02-17 NOTE — ED Provider Notes (Signed)
CSN: [id number]     Arrival date & time 02/17/15  0355 History   First MD Initiated Contact with Patient 02/17/15 [phone]     No chief complaint on file.    (Consider location/radiation/quality/duration/timing/severity/associated sxs/prior Treatment) Patient is a 34 y.o. female presenting with animal bite. The history is provided by the patient.  Animal Bite Contact animal:  Snake and insect Location:  Shoulder/arm Shoulder/arm injury location:  L arm and R arm Pain details:    Severity:  No pain   Timing:  Constant   Progression:  Unchanged Incident location:  Home Notifications:  Health department Tetanus status:  Up to date Relieved by:  Nothing Worsened by:  Nothing tried Ineffective treatments:  None tried Associated symptoms: no fever     Past Medical History  Diagnosis Date  . Obese   . Thyroid disease   . Sickle cell trait   . Anemia    Past Surgical History  Procedure Laterality Date  . Knee surgery    . Finger nail surgery    . Wisdom tooth extraction    . Cesarean section with bilateral tubal ligation N/A 08/23/2013    Procedure: Primary Cesarean Section Delivery Baby Girl @ 2347, Apgars 8/9, Bilateral Tubal Ligation;  Surgeon: Antionette Char, MD;  Location: [facility];  Service: Obstetrics;  Laterality: N/A;   Family History  Problem Relation Age of Onset  . Hypertension Mother   . Diabetes Mother   . Heart disease Mother   . Sickle cell trait Mother    History  Substance Use Topics  . Smoking status: Former Smoker    Types: Cigarettes    Quit date: 01/01/2013  . Smokeless tobacco: Never Used  . Alcohol Use: No   OB History    Gravida Para Term Preterm AB TAB SAB Ectopic Multiple Living   5 5 4 1      5      Review of Systems  Constitutional: Negative for fever.  All other systems reviewed and are negative.     Allergies  Review of patient's allergies indicates no known allergies.  Home Medications   Prior to Admission medications    Medication Sig Start Date End Date Taking? Authorizing Provider  ibuprofen (ADVIL,MOTRIN) 600 MG tablet Take 1 tablet (600 mg total) by mouth every 6 (six) hours as needed for mild pain. 08/27/13   Brock Bad, MD  Iron-FA-B Cmp-C-Biot-Probiotic (FUSION PLUS) CAPS Take 1 capsule by mouth daily before breakfast. 08/27/13   Brock Bad, MD  nitrofurantoin, macrocrystal-monohydrate, (MACROBID) 100 MG capsule Take 1 capsule (100 mg total) by mouth 2 (two) times daily. 08/19/13   Brock Bad, MD  oxyCODONE-acetaminophen (PERCOCET/ROXICET) 5-325 MG per tablet Take 1-2 tablets by mouth every 4 (four) hours as needed for severe pain (moderate - severe pain). 08/27/13   Brock Bad, MD  Prenatal Vit-Fe Fumarate-FA (PRENATAL MULTIVITAMIN) TABS tablet Take 1 tablet by mouth daily at 12 noon.    Historical Provider, MD   There were no vitals taken for this visit. Physical Exam  Constitutional: She is oriented to person, place, and time. She appears well-developed and well-nourished. No distress.  HENT:  Head: Normocephalic and atraumatic.  Mouth/Throat: Oropharynx is clear and moist.  Eyes: Conjunctivae are normal. Pupils are equal, round, and reactive to light.  Neck: Normal range of motion. Neck supple.  Cardiovascular: Normal rate, regular rhythm and intact distal pulses.   Pulmonary/Chest: Effort normal and breath sounds normal. No respiratory distress. She  has no wheezes. She has no rales.  Abdominal: Soft. Bowel sounds are normal. There is no tenderness. There is no rebound and no guarding.  Musculoskeletal: Normal range of motion.  Neurological: She is alert and oriented to person, place, and time.  Skin: Skin is warm and dry.  Psychiatric: She has a normal mood and affect.    ED Course  Procedures (including critical care time) Labs Review Labs Reviewed - No data to display  Imaging Review No results found.   EKG Interpretation None      MDM   Final diagnoses:   Bed bug bite    Bleach all linens and garments in hot water exterminate home.  All family members seen    Lavance Beazer, MD 02/17/15 [phone]

## 2015-02-17 NOTE — ED Notes (Signed)
Pt c/o bed bug bites. MD at bedside during triage.  

## 2015-07-24 ENCOUNTER — Emergency Department (HOSPITAL_COMMUNITY)
Admission: EM | Admit: 2015-07-24 | Discharge: 2015-07-24 | Disposition: A | Discharge status: Home / Self Care | Payer: Medicaid Other | Attending: Emergency Medicine | Admitting: Emergency Medicine

## 2015-07-24 ENCOUNTER — Encounter (HOSPITAL_COMMUNITY): Payer: Self-pay | Admitting: Emergency Medicine

## 2015-07-24 DIAGNOSIS — R197 Diarrhea, unspecified: Secondary | ICD-10-CM | POA: Diagnosis not present

## 2015-07-24 DIAGNOSIS — Z87891 Personal history of nicotine dependence: Secondary | ICD-10-CM | POA: Insufficient documentation

## 2015-07-24 DIAGNOSIS — R110 Nausea: Secondary | ICD-10-CM | POA: Diagnosis not present

## 2015-07-24 DIAGNOSIS — R109 Unspecified abdominal pain: Secondary | ICD-10-CM | POA: Insufficient documentation

## 2015-07-24 DIAGNOSIS — Z3202 Encounter for pregnancy test, result negative: Secondary | ICD-10-CM | POA: Insufficient documentation

## 2015-07-24 DIAGNOSIS — Z862 Personal history of diseases of the blood and blood-forming organs and certain disorders involving the immune mechanism: Secondary | ICD-10-CM | POA: Diagnosis not present

## 2015-07-24 DIAGNOSIS — E669 Obesity, unspecified: Secondary | ICD-10-CM | POA: Diagnosis not present

## 2015-07-24 LAB — COMPREHENSIVE METABOLIC PANEL
ALT: 13 U/L — ABNORMAL LOW (ref 14–54)
AST: 18 U/L (ref 15–41)
Albumin: 3.2 g/dL — ABNORMAL LOW (ref 3.5–5.0)
Alkaline Phosphatase: 68 U/L (ref 38–126)
Anion gap: 7 (ref 5–15)
BUN: 8 mg/dL (ref 6–20)
CO2: 22 mmol/L (ref 22–32)
Calcium: 8.7 mg/dL — ABNORMAL LOW (ref 8.9–10.3)
Chloride: 109 mmol/L (ref 101–111)
Creatinine, Ser: 0.72 mg/dL (ref 0.44–1.00)
GFR calc Af Amer: >60 mL/min (ref >60)
GFR calc non Af Amer: >60 mL/min (ref >60)
Glucose, Bld: 108 mg/dL — ABNORMAL HIGH (ref 65–99)
Potassium: 3.9 mmol/L (ref 3.5–5.1)
Sodium: 138 mmol/L (ref 135–145)
Total Bilirubin: 0.1 mg/dL — ABNORMAL LOW (ref 0.3–1.2)
Total Protein: 6.4 g/dL — ABNORMAL LOW (ref 6.5–8.1)

## 2015-07-24 LAB — URINE MICROSCOPIC-ADD ON

## 2015-07-24 LAB — URINALYSIS, ROUTINE W REFLEX MICROSCOPIC
Bilirubin Urine: NEGATIVE
Glucose, UA: NEGATIVE mg/dL
Ketones, ur: NEGATIVE mg/dL
Leukocytes, UA: NEGATIVE
Nitrite: NEGATIVE
Protein, ur: NEGATIVE mg/dL
Specific Gravity, Urine: 1.014 (ref 1.005–1.030)
pH: 6.5 (ref 5.0–8.0)

## 2015-07-24 LAB — CBC WITH DIFFERENTIAL/PLATELET
Basophils Absolute: 0 10*3/uL (ref 0.0–0.1)
Basophils Relative: 0 %
Eosinophils Absolute: 0.2 10*3/uL (ref 0.0–0.7)
Eosinophils Relative: 3 %
HCT: 32.6 % — ABNORMAL LOW (ref 36.0–46.0)
Hemoglobin: 10.8 g/dL — ABNORMAL LOW (ref 12.0–15.0)
Lymphocytes Relative: 33 %
Lymphs Abs: 2.3 10*3/uL (ref 0.7–4.0)
MCH: 23.6 pg — ABNORMAL LOW (ref 26.0–34.0)
MCHC: 33.1 g/dL (ref 30.0–36.0)
MCV: 71.2 fL — ABNORMAL LOW (ref 78.0–100.0)
Monocytes Absolute: 0.4 10*3/uL (ref 0.1–1.0)
Monocytes Relative: 6 %
Neutro Abs: 3.9 10*3/uL (ref 1.7–7.7)
Neutrophils Relative %: 58 %
Platelets: 350 10*3/uL (ref 150–400)
RBC: 4.58 MIL/uL (ref 3.87–5.11)
RDW: 15.3 % (ref 11.5–15.5)
WBC: 6.8 10*3/uL (ref 4.0–10.5)

## 2015-07-24 LAB — LIPASE, BLOOD: Lipase: 39 U/L (ref 11–51)

## 2015-07-24 LAB — PREGNANCY, URINE: Preg Test, Ur: NEGATIVE

## 2015-07-24 MED ORDER — LOPERAMIDE HCL 2 MG PO CAPS: 2.0000 mg | ORAL_CAPSULE | Freq: Four times a day (QID) | ORAL | Status: DC | PRN

## 2015-07-24 MED ORDER — SODIUM CHLORIDE 0.9 % IV BOLUS (SEPSIS)
2000.0000 mL | Freq: Once | INTRAVENOUS | Status: AC
  Administered 2015-07-24: 2000 mL via INTRAVENOUS

## 2015-07-24 MED ORDER — PROMETHAZINE HCL 25 MG PO TABS: 25.0000 mg | ORAL_TABLET | Freq: Three times a day (TID) | ORAL | Status: DC | PRN

## 2015-07-24 NOTE — ED Provider Notes (Signed)
CSN: [id number]     Arrival date & time 07/24/15  0754 History   First MD Initiated Contact with Patient 07/24/15 0818     Chief Complaint  Patient presents with  . Nausea  . Diarrhea     (Consider location/radiation/quality/duration/timing/severity/associated sxs/prior Treatment) HPI Patient presents to the emergency department with nausea and diarrhea.  Patient states it started 1 week ago.  The patient states that she is currently breast-feeding.  She also has abdominal pain.  Patient states she did not try any medications prior to arrival.  The patient states that nothing seems make her condition better or worse.  Patient denies chest pain, shortness of breath, weakness, dizziness, headache, blurred vision, back pain, neck pain, fever, cough, runny nose, sore throat, back pain, dysuria, incontinence, hematemesis, bloody stool, near syncope or syncope.  The patient states that she did not contact her doctor about these symptoms Past Medical History  Diagnosis Date  . Obese   . Thyroid disease   . Sickle cell trait (HCC)   . Anemia    Past Surgical History  Procedure Laterality Date  . Knee surgery    . Finger nail surgery    . Wisdom tooth extraction    . Cesarean section with bilateral tubal ligation N/A 08/23/2013    Procedure: Primary Cesarean Section Delivery Baby Girl @ 2347, Apgars 8/9, Bilateral Tubal Ligation;  Surgeon: Antionette Char, MD;  Location: [facility];  Service: Obstetrics;  Laterality: N/A;   Family History  Problem Relation Age of Onset  . Hypertension Mother   . Diabetes Mother   . Heart disease Mother   . Sickle cell trait Mother    Social History  Substance Use Topics  . Smoking status: Former Smoker    Types: Cigarettes    Quit date: 01/01/2013  . Smokeless tobacco: Never Used  . Alcohol Use: No   OB History    Gravida Para Term Preterm AB TAB SAB Ectopic Multiple Living   Review of Systems  All other systems negative  except as documented in the HPI. All pertinent positives and negatives as reviewed in the HPI.  Allergies  Review of patient's allergies indicates no known allergies.  Home Medications   Prior to Admission medications   Medication Sig Start Date End Date Taking? Authorizing Provider  ibuprofen (ADVIL,MOTRIN) 600 MG tablet Take 1 tablet (600 mg total) by mouth every 6 (six) hours as needed for mild pain. Patient not taking: Reported on 07/24/2015 08/27/13   Brock Bad, MD  Iron-FA-B Cmp-C-Biot-Probiotic (FUSION PLUS) CAPS Take 1 capsule by mouth daily before breakfast. Patient not taking: Reported on 07/24/2015 08/27/13   Brock Bad, MD  nitrofurantoin, macrocrystal-monohydrate, (MACROBID) 100 MG capsule Take 1 capsule (100 mg total) by mouth 2 (two) times daily. Patient not taking: Reported on 07/24/2015 08/19/13   Brock Bad, MD  oxyCODONE-acetaminophen (PERCOCET/ROXICET) 5-325 MG per tablet Take 1-2 tablets by mouth every 4 (four) hours as needed for severe pain (moderate - severe pain). Patient not taking: Reported on 07/24/2015 08/27/13   Brock Bad, MD  Prenatal Vit-Fe Fumarate-FA (PRENATAL MULTIVITAMIN) TABS tablet Take 1 tablet by mouth daily at 12 noon.    Historical Provider, MD   BP 133/78 mmHg  Pulse 79  Temp(Src) 97.8 F (36.6 C) (Oral)  Resp 16  Ht  (1.6 m)  Wt 113.399 kg  BMI 44.30 kg/m2  SpO2  99%  LMP 06/13/2015  Breastfeeding? Yes Physical Exam  Constitutional: She is oriented to person, place, and time. She appears well-developed and well-nourished. No distress.  HENT:  Head: Normocephalic and atraumatic.  Mouth/Throat: Oropharynx is clear and moist.  Eyes: Pupils are equal, round, and reactive to light.  Neck: Normal range of motion. Neck supple.  Cardiovascular: Normal rate, regular rhythm and normal heart sounds.  Exam reveals no gallop and no friction rub.   No murmur heard. Pulmonary/Chest: Effort normal and breath sounds  normal. No respiratory distress. She has no wheezes.  Abdominal: Soft. Bowel sounds are normal. She exhibits no distension. There is tenderness. There is no rebound and no guarding.  Neurological: She is alert and oriented to person, place, and time. She exhibits normal muscle tone. Coordination normal.  Skin: Skin is warm and dry. No rash noted. No erythema.  Psychiatric: She has a normal mood and affect. Her behavior is normal.  Nursing note and vitals reviewed.   ED Course  Procedures (including critical care time) Labs Review Labs Reviewed  URINALYSIS, ROUTINE W REFLEX MICROSCOPIC (NOT AT [facility]) - Abnormal; Notable for the following:    APPearance CLOUDY (*)    Hgb urine dipstick TRACE (*)    All other components within normal limits  COMPREHENSIVE METABOLIC PANEL - Abnormal; Notable for the following:    Glucose, Bld 108 (*)    Calcium 8.7 (*)    Total Protein 6.4 (*)    Albumin 3.2 (*)    ALT 13 (*)    Total Bilirubin 0.1 (*)    All other components within normal limits  CBC WITH DIFFERENTIAL/PLATELET - Abnormal; Notable for the following:    Hemoglobin 10.8 (*)    HCT 32.6 (*)    MCV 71.2 (*)    MCH 23.6 (*)    All other components within normal limits  URINE MICROSCOPIC-ADD ON - Abnormal; Notable for the following:    Squamous Epithelial / LPF 6-30 (*)    Bacteria, UA FEW (*)    All other components within normal limits  PREGNANCY, URINE  LIPASE, BLOOD    Imaging Review No results found. I have personally reviewed and evaluated these images and lab results as part of my medical decision-making.  Patient be discharged home.  She most likely has a gastroenteritis.  Told to return here as needed.  Patient agrees the plan.  All questions were answered    Charlestine NightChristopher Jazper Nikolai, PA-C 07/26/15 2104  Pricilla LovelessScott Goldston, MD 07/29/15 1010

## 2015-07-24 NOTE — ED Notes (Signed)
Pt has been having diarrhea for the past week along with nausea. Pt is breastfeeding. Pt c/o abdominal pain. Pt A&OX4, NAD noted.

## 2015-07-24 NOTE — Discharge Instructions (Signed)
Return here as needed.  Follow-up with your primary care doctor, increase your fluid intake and rest as much possible °

## 2015-08-14 ENCOUNTER — Encounter (HOSPITAL_COMMUNITY): Payer: Self-pay | Admitting: Emergency Medicine

## 2015-08-14 ENCOUNTER — Emergency Department (HOSPITAL_COMMUNITY)
Admission: EM | Admit: 2015-08-14 | Discharge: 2015-08-14 | Disposition: A | Discharge status: Home / Self Care | Payer: Medicaid Other | Attending: Emergency Medicine | Admitting: Emergency Medicine

## 2015-08-14 DIAGNOSIS — N390 Urinary tract infection, site not specified: Secondary | ICD-10-CM | POA: Diagnosis not present

## 2015-08-14 DIAGNOSIS — N760 Acute vaginitis: Secondary | ICD-10-CM | POA: Diagnosis not present

## 2015-08-14 DIAGNOSIS — Z202 Contact with and (suspected) exposure to infections with a predominantly sexual mode of transmission: Secondary | ICD-10-CM | POA: Diagnosis present

## 2015-08-14 DIAGNOSIS — E669 Obesity, unspecified: Secondary | ICD-10-CM | POA: Diagnosis not present

## 2015-08-14 DIAGNOSIS — Z79899 Other long term (current) drug therapy: Secondary | ICD-10-CM | POA: Diagnosis not present

## 2015-08-14 DIAGNOSIS — Z113 Encounter for screening for infections with a predominantly sexual mode of transmission: Secondary | ICD-10-CM

## 2015-08-14 DIAGNOSIS — B9689 Other specified bacterial agents as the cause of diseases classified elsewhere: Secondary | ICD-10-CM

## 2015-08-14 DIAGNOSIS — D649 Anemia, unspecified: Secondary | ICD-10-CM | POA: Insufficient documentation

## 2015-08-14 DIAGNOSIS — Z3202 Encounter for pregnancy test, result negative: Secondary | ICD-10-CM | POA: Insufficient documentation

## 2015-08-14 LAB — URINALYSIS, ROUTINE W REFLEX MICROSCOPIC
Bilirubin Urine: NEGATIVE
Glucose, UA: NEGATIVE mg/dL
Hgb urine dipstick: NEGATIVE
Ketones, ur: NEGATIVE mg/dL
Nitrite: POSITIVE — AB
PH: 6 (ref 5.0–8.0)
Protein, ur: NEGATIVE mg/dL
SPECIFIC GRAVITY, URINE: 1.017 (ref 1.005–1.030)

## 2015-08-14 LAB — URINE MICROSCOPIC-ADD ON: RBC / HPF: NONE SEEN RBC/hpf (ref 0–5)

## 2015-08-14 LAB — WET PREP, GENITAL
SPERM: NONE SEEN
Trich, Wet Prep: NONE SEEN
YEAST WET PREP: NONE SEEN

## 2015-08-14 LAB — POC URINE PREG, ED: Preg Test, Ur: NEGATIVE

## 2015-08-14 MED ORDER — FLUCONAZOLE 150 MG PO TABS: 150.0000 mg | ORAL_TABLET | Freq: Once | ORAL | Status: AC | PRN

## 2015-08-14 MED ORDER — CEPHALEXIN 250 MG PO CAPS: 500.0000 mg | ORAL_CAPSULE | Freq: Once | ORAL | Status: DC

## 2015-08-14 MED ORDER — METRONIDAZOLE 500 MG PO TABS: 500.0000 mg | ORAL_TABLET | Freq: Two times a day (BID) | ORAL | Status: DC

## 2015-08-14 MED ORDER — AZITHROMYCIN 250 MG PO TABS
1000.0000 mg | ORAL_TABLET | Freq: Once | ORAL | Status: AC
  Administered 2015-08-14: 1000 mg via ORAL
  Filled 2015-08-14: qty 4

## 2015-08-14 MED ORDER — STERILE WATER FOR INJECTION IJ SOLN
INTRAMUSCULAR | Status: AC
  Administered 2015-08-14: 10 mL
  Filled 2015-08-14: qty 10

## 2015-08-14 MED ORDER — CEFTRIAXONE SODIUM 250 MG IJ SOLR
250.0000 mg | Freq: Once | INTRAMUSCULAR | Status: AC
  Administered 2015-08-14: 250 mg via INTRAMUSCULAR
  Filled 2015-08-14: qty 250

## 2015-08-14 MED ORDER — CEPHALEXIN 500 MG PO CAPS: 500.0000 mg | ORAL_CAPSULE | Freq: Two times a day (BID) | ORAL | Status: DC

## 2015-08-14 MED ORDER — METRONIDAZOLE 500 MG PO TABS
500.0000 mg | ORAL_TABLET | Freq: Once | ORAL | Status: AC
  Administered 2015-08-14: 500 mg via ORAL
  Filled 2015-08-14: qty 1

## 2015-08-14 NOTE — ED Notes (Signed)
Pt sts informed by sexual partner that she may have been exposed to and STD

## 2015-08-14 NOTE — Discharge Instructions (Signed)
You were evaluated in the emergency room today. As we discussed, we treated you empirically for STIs with two antibiotics in the ER. Your tests also show evidence of Bacterial Vaginosis as well as a urinary tract infection. I will give you prescriptions for antibiotics to take for one week. Please take the entire course as prescribed. You might develop a yeast infection. If you do, I have given you a prescription for diflucan. Please follow-up with your primary care provider within one week. Return to the ER for new or worsening symptoms.    Bacterial Vaginosis Bacterial vaginosis is a vaginal infection that occurs when the normal balance of bacteria in the vagina is disrupted. It results from an overgrowth of certain bacteria. This is the most common vaginal infection in women of childbearing age. Treatment is important to prevent complications, especially in pregnant women, as it can cause a premature delivery. CAUSES  Bacterial vaginosis is caused by an increase in harmful bacteria that are normally present in smaller amounts in the vagina. Several different kinds of bacteria can cause bacterial vaginosis. However, the reason that the condition develops is not fully understood. RISK FACTORS Certain activities or behaviors can put you at an increased risk of developing bacterial vaginosis, including:  Having a new sex partner or multiple sex partners.  Douching.  Using an intrauterine device (IUD) for contraception. Women do not get bacterial vaginosis from toilet seats, bedding, swimming pools, or contact with objects around them. SIGNS AND SYMPTOMS  Some women with bacterial vaginosis have no signs or symptoms. Common symptoms include:  Grey vaginal discharge.  A fishlike odor with discharge, especially after sexual intercourse.  Itching or burning of the vagina and vulva.  Burning or pain with urination. DIAGNOSIS  Your health care provider will take a medical history and examine the  vagina for signs of bacterial vaginosis. A sample of vaginal fluid may be taken. Your health care provider will look at this sample under a microscope to check for bacteria and abnormal cells. A vaginal pH test may also be done.  TREATMENT  Bacterial vaginosis may be treated with antibiotic medicines. These may be given in the form of a pill or a vaginal cream. A second round of antibiotics may be prescribed if the condition comes back after treatment. Because bacterial vaginosis increases your risk for sexually transmitted diseases, getting treated can help reduce your risk for chlamydia, gonorrhea, HIV, and herpes. HOME CARE INSTRUCTIONS   Only take over-the-counter or prescription medicines as directed by your health care provider.  If antibiotic medicine was prescribed, take it as directed. Make sure you finish it even if you start to feel better.  Tell all sexual partners that you have a vaginal infection. They should see their health care provider and be treated if they have problems, such as a mild rash or itching.  During treatment, it is important that you follow these instructions:  Avoid sexual activity or use condoms correctly.  Do not douche.  Avoid alcohol as directed by your health care provider.  Avoid breastfeeding as directed by your health care provider. SEEK MEDICAL CARE IF:   Your symptoms are not improving after 3 days of treatment.  You have increased discharge or pain.  You have a fever. MAKE SURE YOU:   Understand these instructions.  Will watch your condition.  Will get help right away if you are not doing well or get worse. FOR MORE INFORMATION  Centers for Disease Control and Prevention, Division of  STD Prevention: SolutionApps.co.zawww.cdc.gov/std American Sexual Health Association (ASHA): www.ashastd.org    This information is not intended to replace advice given to you by your health care provider. Make sure you discuss any questions you have with your health care  provider.   Document Released: 08/14/2005 Document Revised: 09/04/2014 Document Reviewed: 03/26/2013 Elsevier Interactive Patient Education [employer]2016 Elsevier Inc.

## 2015-08-14 NOTE — ED Provider Notes (Signed)
CSN: [id number]     Arrival date & time 08/14/15  1037 History   First MD Initiated Contact with Patient 08/14/15 1131     Chief Complaint  Patient presents with  . Exposure to STD    HPI   Megan [name] is an 34 y.o. female with history of sickle cell trait, obesity who presents to the ED for STD check. She states she is sexually active with one female who called her yesterday and told her she needs to be tested for STDs as he is having penile discharge. Pt states she is asymptomatic. She states that they have not been using condoms. She is not on OCP. She is unsure if she is pregnant. She denies vaginal discharge, bleeding, new lesions. Denies dysuria, urinary frequency, urgency. Denies fever, chills, abd pain, back pain. LMP mid November.  Past Medical History  Diagnosis Date  . Obese   . Thyroid disease   . Sickle cell trait (HCC)   . Anemia    Past Surgical History  Procedure Laterality Date  . Knee surgery    . Finger nail surgery    . Wisdom tooth extraction    . Cesarean section with bilateral tubal ligation N/A 08/23/2013    Procedure: Primary Cesarean Section Delivery Baby Girl @ 2347, Apgars 8/9, Bilateral Tubal Ligation;  Surgeon: Antionette Char, MD;  Location: [facility];  Service: Obstetrics;  Laterality: N/A;   Family History  Problem Relation Age of Onset  . Hypertension Mother   . Diabetes Mother   . Heart disease Mother   . Sickle cell trait Mother    Social History  Substance Use Topics  . Smoking status: Former Smoker    Types: Cigarettes    Quit date: 01/01/2013  . Smokeless tobacco: Never Used  . Alcohol Use: No   OB History    Gravida Para Term Preterm AB TAB SAB Ectopic Multiple Living   Review of Systems  All other systems reviewed and are negative.     Allergies  Review of patient's allergies indicates no known allergies.  Home Medications   Prior to Admission medications   Medication Sig Start Date End Date Taking?  Authorizing Provider  ibuprofen (ADVIL,MOTRIN) 600 MG tablet Take 1 tablet (600 mg total) by mouth every 6 (six) hours as needed for mild pain. Patient not taking: Reported on 07/24/2015 08/27/13   Brock Bad, MD  Iron-FA-B Cmp-C-Biot-Probiotic (FUSION PLUS) CAPS Take 1 capsule by mouth daily before breakfast. Patient not taking: Reported on 07/24/2015 08/27/13   Brock Bad, MD  loperamide (IMODIUM) 2 MG capsule Take 1 capsule (2 mg total) by mouth 4 (four) times daily as needed for diarrhea or loose stools. 07/24/15   Charlestine Night, PA-C  nitrofurantoin, macrocrystal-monohydrate, (MACROBID) 100 MG capsule Take 1 capsule (100 mg total) by mouth 2 (two) times daily. Patient not taking: Reported on 07/24/2015 08/19/13   Brock Bad, MD  oxyCODONE-acetaminophen (PERCOCET/ROXICET) 5-325 MG per tablet Take 1-2 tablets by mouth every 4 (four) hours as needed for severe pain (moderate - severe pain). Patient not taking: Reported on 07/24/2015 08/27/13   Brock Bad, MD  Prenatal Vit-Fe Fumarate-FA (PRENATAL MULTIVITAMIN) TABS tablet Take 1 tablet by mouth daily at 12 noon.    Historical Provider, MD  promethazine (PHENERGAN) 25 MG tablet Take 1 tablet (25 mg total) by mouth every 8 (eight) hours as needed for nausea or  vomiting. 07/24/15   Christopher Lawyer, PA-C   BP 160/90 mmHg  Pulse 85  Temp(Src) 98.6 F (37 C) (Oral)  Resp 18  SpO2 99%  LMP 06/13/2015 Physical Exam  Constitutional: She is oriented to person, place, and time. No distress.  HENT:  Right Ear: External ear normal.  Left Ear: External ear normal.  Nose: Nose normal.  Mouth/Throat: Oropharynx is clear and moist. No oropharyngeal exudate.  Eyes: Conjunctivae and EOM are normal. Pupils are equal, round, and reactive to light.  Neck: Normal range of motion. Neck supple.  Cardiovascular: Normal rate, regular rhythm, normal heart sounds and intact distal pulses.   Pulmonary/Chest: Effort normal and breath  sounds normal. No respiratory distress. She has no wheezes. She exhibits no tenderness.  Abdominal: Soft. Bowel sounds are normal. She exhibits no distension. There is no tenderness. There is no rebound, no guarding and no CVA tenderness.  Genitourinary: Cervix exhibits no motion tenderness and no friability. Right adnexum displays no tenderness. Left adnexum displays no tenderness.  Thin white discharge coating vaginal canal  Musculoskeletal: She exhibits no edema.  Neurological: She is alert and oriented to person, place, and time. No cranial nerve deficit.  Skin: Skin is warm and dry. She is not diaphoretic.  Psychiatric: She has a normal mood and affect.  Nursing note and vitals reviewed.   ED Course  Procedures (including critical care time) Labs Review Labs Reviewed  WET PREP, GENITAL - Abnormal; Notable for the following:    Clue Cells Wet Prep HPF POC PRESENT (*)    WBC, Wet Prep HPF POC PRESENT (*)    All other components within normal limits  URINALYSIS, ROUTINE W REFLEX MICROSCOPIC (NOT AT [facility]) - Abnormal; Notable for the following:    APPearance CLOUDY (*)    Nitrite POSITIVE (*)    Leukocytes, UA TRACE (*)    All other components within normal limits  URINE MICROSCOPIC-ADD ON - Abnormal; Notable for the following:    Squamous Epithelial / LPF 0-5 (*)    Bacteria, UA MANY (*)    All other components within normal limits  URINE CULTURE  HIV ANTIBODY (ROUTINE TESTING)  RPR  POC URINE PREG, ED  GC/CHLAMYDIA PROBE AMP ([place]) NOT AT [facility]    Imaging Review No results found. I have personally reviewed and evaluated these images and lab results as part of my medical decision-making.   EKG Interpretation None      MDM   Final diagnoses:  Encounter for screening examination for sexually transmitted disease  Urinary tract infection without hematuria, site unspecified  Bacterial vaginosis    Pt is asymptomatic but has symptomatic sexual partner. Will treat  empirically with rocephin and azithromycin and send gc/chlamydia. Vaginal canal with discharge, if wet prep shows clue cells will give rx for Flagyl for BV. Also checking RPR, HIV, and UA.  Clue cells and WBC on wet prep. Will give rx for flagyl and give first dose here. UA also shows positive nitrites with many bacteria and trace leuks. Although pt denies dysuria or other urinary symptoms, given positive Nitrite I will treat UTI. Will give rx for Keflex. Since pt will be on several antibiotics will give rx for diflucan if she develops a yeast infection. PCP f/u.   Carlene CoriaSerena Y Joyce Heitman, PA-C 08/14/15 1433  Melene Planan Floyd, DO 08/14/15 [phone]

## 2015-08-14 NOTE — ED Notes (Signed)
Patient here for STD check, reports bf states that they had discharge from penis. Patient denies any symptoms.

## 2015-08-15 LAB — RPR: RPR Ser Ql: NONREACTIVE

## 2015-08-15 LAB — HIV ANTIBODY (ROUTINE TESTING W REFLEX): HIV Screen 4th Generation wRfx: NONREACTIVE

## 2015-08-16 LAB — GC/CHLAMYDIA PROBE AMP (~~LOC~~) NOT AT ARMC
Chlamydia: NEGATIVE
Neisseria Gonorrhea: NEGATIVE

## 2015-08-17 LAB — URINE CULTURE

## 2015-08-18 ENCOUNTER — Telehealth (HOSPITAL_COMMUNITY): Payer: Self-pay

## 2015-08-18 NOTE — Telephone Encounter (Signed)
Post ED Visit - Positive Culture Follow-up  Culture report reviewed by antimicrobial stewardship pharmacist:  [x]  Enzo BiNathan Batchelder, Pharm.D. []  Celedonio MiyamotoJeremy Frens, Pharm.D., BCPS []  Garvin FilaMike Maccia, Pharm.D. []  Georgina PillionElizabeth Martin, Pharm.D., BCPS []  [place] GroveMinh Pham, [address]Ph[address] BCPS, AAHIVP []  Estella HuskMichelle Turner, Pharm.D., BCPS, AAHIVP []  Cassie Roseanne RenoStewart, [address] []  Sherle Poeob Vincent, [address]  Positive urine culture, >/= 100,000 colonies -> E Coli Treated with Cephalexin, organism sensitive to the same and no further patient follow-up is required at this time.  Arvid RightClark, Jayma Volpi Dorn 08/18/2015, 9:53 AM

## 2016-01-15 ENCOUNTER — Emergency Department (HOSPITAL_COMMUNITY)
Admission: EM | Admit: 2016-01-15 | Discharge: 2016-01-16 | Disposition: A | Discharge status: Home / Self Care | Payer: Medicaid Other | Attending: Emergency Medicine | Admitting: Emergency Medicine

## 2016-01-15 ENCOUNTER — Encounter (HOSPITAL_COMMUNITY): Payer: Self-pay

## 2016-01-15 DIAGNOSIS — S0990XA Unspecified injury of head, initial encounter: Secondary | ICD-10-CM | POA: Diagnosis present

## 2016-01-15 DIAGNOSIS — Z87891 Personal history of nicotine dependence: Secondary | ICD-10-CM | POA: Insufficient documentation

## 2016-01-15 DIAGNOSIS — Y9289 Other specified places as the place of occurrence of the external cause: Secondary | ICD-10-CM | POA: Diagnosis not present

## 2016-01-15 DIAGNOSIS — E669 Obesity, unspecified: Secondary | ICD-10-CM | POA: Diagnosis not present

## 2016-01-15 DIAGNOSIS — W228XXA Striking against or struck by other objects, initial encounter: Secondary | ICD-10-CM | POA: Diagnosis not present

## 2016-01-15 DIAGNOSIS — Z79899 Other long term (current) drug therapy: Secondary | ICD-10-CM | POA: Diagnosis not present

## 2016-01-15 DIAGNOSIS — S0083XA Contusion of other part of head, initial encounter: Secondary | ICD-10-CM | POA: Insufficient documentation

## 2016-01-15 DIAGNOSIS — Y9389 Activity, other specified: Secondary | ICD-10-CM | POA: Insufficient documentation

## 2016-01-15 DIAGNOSIS — D649 Anemia, unspecified: Secondary | ICD-10-CM | POA: Insufficient documentation

## 2016-01-15 DIAGNOSIS — Y998 Other external cause status: Secondary | ICD-10-CM | POA: Diagnosis not present

## 2016-01-15 DIAGNOSIS — Z792 Long term (current) use of antibiotics: Secondary | ICD-10-CM | POA: Diagnosis not present

## 2016-01-15 MED ORDER — ACETAMINOPHEN 500 MG PO TABS
1000.0000 mg | ORAL_TABLET | Freq: Once | ORAL | Status: AC
  Administered 2016-01-16: 1000 mg via ORAL
  Filled 2016-01-15: qty 2

## 2016-01-15 MED ORDER — IBUPROFEN 800 MG PO TABS
800.0000 mg | ORAL_TABLET | Freq: Once | ORAL | Status: AC
  Administered 2016-01-16: 800 mg via ORAL
  Filled 2016-01-15: qty 1

## 2016-01-15 NOTE — ED Notes (Signed)
Onset 3:30p pt was playing with her baby and hit her head on cabinet.  Swelling noted to forehead.  Pt c/o headache.

## 2016-01-16 NOTE — ED Provider Notes (Signed)
CSN: [id number]     Arrival date & time 01/15/16  2229 History   First MD Initiated Contact with Patient 01/15/16 2347     Chief Complaint  Patient presents with  . Head Injury     (Consider location/radiation/quality/duration/timing/severity/associated sxs/prior Treatment) HPI...Marland KitchenMarland KitchenPatient accidentally struck her right forehead on a cabinet at 4 PM. She complains of a headache.  She is ambulatory without neurological deficits. She has taken nothing for the headache at home. Severity of pain is mild.  Past Medical History  Diagnosis Date  . Obese   . Thyroid disease   . Sickle cell trait (HCC)   . Anemia    Past Surgical History  Procedure Laterality Date  . Knee surgery    . Finger nail surgery    . Wisdom tooth extraction    . Cesarean section with bilateral tubal ligation N/A 08/23/2013    Procedure: Primary Cesarean Section Delivery Baby Girl @ 2347, Apgars 8/9, Bilateral Tubal Ligation;  Surgeon: Antionette Char, MD;  Location: [facility];  Service: Obstetrics;  Laterality: N/A;   Family History  Problem Relation Age of Onset  . Hypertension Mother   . Diabetes Mother   . Heart disease Mother   . Sickle cell trait Mother    Social History  Substance Use Topics  . Smoking status: Former Smoker    Types: Cigarettes    Quit date: 01/01/2013  . Smokeless tobacco: Never Used  . Alcohol Use: No   OB History    Gravida Para Term Preterm AB TAB SAB Ectopic Multiple Living   Review of Systems  All other systems reviewed and are negative.     Allergies  Review of patient's allergies indicates no known allergies.  Home Medications   Prior to Admission medications   Medication Sig Start Date End Date Taking? Authorizing Provider  cephALEXin (KEFLEX) 500 MG capsule Take 1 capsule (500 mg total) by mouth 2 (two) times daily. 08/14/15   Ace Gins Sam, PA-C  ibuprofen (ADVIL,MOTRIN) 600 MG tablet Take 1 tablet (600 mg total) by mouth every 6 (six)  hours as needed for mild pain. Patient not taking: Reported on 07/24/2015 08/27/13   Brock Bad, MD  Iron-FA-B Cmp-C-Biot-Probiotic (FUSION PLUS) CAPS Take 1 capsule by mouth daily before breakfast. Patient not taking: Reported on 07/24/2015 08/27/13   Brock Bad, MD  loperamide (IMODIUM) 2 MG capsule Take 1 capsule (2 mg total) by mouth 4 (four) times daily as needed for diarrhea or loose stools. 07/24/15   Charlestine Night, PA-C  metroNIDAZOLE (FLAGYL) 500 MG tablet Take 1 tablet (500 mg total) by mouth 2 (two) times daily. 08/14/15   Ace Gins Sam, PA-C  nitrofurantoin, macrocrystal-monohydrate, (MACROBID) 100 MG capsule Take 1 capsule (100 mg total) by mouth 2 (two) times daily. Patient not taking: Reported on 07/24/2015 08/19/13   Brock Bad, MD  oxyCODONE-acetaminophen (PERCOCET/ROXICET) 5-325 MG per tablet Take 1-2 tablets by mouth every 4 (four) hours as needed for severe pain (moderate - severe pain). Patient not taking: Reported on 07/24/2015 08/27/13   Brock Bad, MD  Prenatal Vit-Fe Fumarate-FA (PRENATAL MULTIVITAMIN) TABS tablet Take 1 tablet by mouth daily at 12 noon.    Historical Provider, MD  promethazine (PHENERGAN) 25 MG tablet Take 1 tablet (25 mg total) by mouth every 8 (eight) hours as needed for nausea or vomiting. 07/24/15   Charlestine Night, PA-C   BP  118/59 mmHg  Pulse 93  Temp(Src) 98.2 F (36.8 C) (Oral)  Resp 20  Ht 5\' 3"  (1.6 m)  Wt 260 lb 9 oz (118.19 kg)  BMI 46.17 kg/m2  SpO2 98%  LMP 12/16/2015 Physical Exam  Constitutional: She is oriented to person, place, and time. She appears well-developed and well-nourished.  HENT:  Head: Normocephalic.  Small right frontal hematoma  Eyes: Conjunctivae and EOM are normal. Pupils are equal, round, and reactive to light.  Neck: Normal range of motion. Neck supple.  Cardiovascular: Normal rate and regular rhythm.   Pulmonary/Chest: Effort normal and breath sounds normal.  Abdominal: Soft.  Bowel sounds are normal.  Musculoskeletal: Normal range of motion.  Neurological: She is alert and oriented to person, place, and time.  Skin: Skin is warm and dry.  Psychiatric: She has a normal mood and affect. Her behavior is normal.  Nursing note and vitals reviewed.   ED Course  Procedures (including critical care time) Labs Review Labs Reviewed - No data to display  Imaging Review No results found. I have personally reviewed and evaluated these images and lab results as part of my medical decision-making.   EKG Interpretation None      MDM   Final diagnoses:  Minor head injury, initial encounter    Patient is a with torn without neurological deficits. She is alert and oriented. No imaging necessary. Tylenol or ibuprofen for pain.    Donnetta HutchingBrian Dimitriy Carreras, MD 01/16/16 Jacinta [id number]

## 2016-01-16 NOTE — Discharge Instructions (Signed)
Tylenol and/or ibuprofen for headache. Ice pack. Return if worse.

## 2017-12-23 ENCOUNTER — Encounter (HOSPITAL_COMMUNITY): Payer: Self-pay | Admitting: *Deleted

## 2017-12-23 ENCOUNTER — Inpatient Hospital Stay (HOSPITAL_COMMUNITY)
Admission: AD | Admit: 2017-12-23 | Discharge: 2017-12-26 | DRG: 885 | Disposition: A | Discharge status: Home / Self Care | Payer: No Typology Code available for payment source | Source: Intra-hospital | Attending: Psychiatry | Admitting: Psychiatry

## 2017-12-23 ENCOUNTER — Emergency Department (HOSPITAL_COMMUNITY)
Admission: EM | Admit: 2017-12-23 | Discharge: 2017-12-23 | Disposition: A | Discharge status: Other Acute Inpatient Hospital | Payer: Self-pay | Attending: Emergency Medicine | Admitting: Emergency Medicine

## 2017-12-23 ENCOUNTER — Encounter (HOSPITAL_COMMUNITY): Payer: Self-pay

## 2017-12-23 ENCOUNTER — Other Ambulatory Visit: Payer: Self-pay

## 2017-12-23 DIAGNOSIS — Z87891 Personal history of nicotine dependence: Secondary | ICD-10-CM | POA: Insufficient documentation

## 2017-12-23 DIAGNOSIS — F1420 Cocaine dependence, uncomplicated: Secondary | ICD-10-CM | POA: Diagnosis not present

## 2017-12-23 DIAGNOSIS — Z6379 Other stressful life events affecting family and household: Secondary | ICD-10-CM | POA: Diagnosis not present

## 2017-12-23 DIAGNOSIS — F1410 Cocaine abuse, uncomplicated: Secondary | ICD-10-CM

## 2017-12-23 DIAGNOSIS — Z6841 Body Mass Index (BMI) 40.0 and over, adult: Secondary | ICD-10-CM | POA: Diagnosis not present

## 2017-12-23 DIAGNOSIS — F1290 Cannabis use, unspecified, uncomplicated: Secondary | ICD-10-CM | POA: Diagnosis present

## 2017-12-23 DIAGNOSIS — G4700 Insomnia, unspecified: Secondary | ICD-10-CM | POA: Diagnosis present

## 2017-12-23 DIAGNOSIS — F411 Generalized anxiety disorder: Secondary | ICD-10-CM | POA: Diagnosis present

## 2017-12-23 DIAGNOSIS — D573 Sickle-cell trait: Secondary | ICD-10-CM | POA: Diagnosis present

## 2017-12-23 DIAGNOSIS — F322 Major depressive disorder, single episode, severe without psychotic features: Secondary | ICD-10-CM

## 2017-12-23 DIAGNOSIS — F1494 Cocaine use, unspecified with cocaine-induced mood disorder: Secondary | ICD-10-CM | POA: Diagnosis not present

## 2017-12-23 DIAGNOSIS — Z630 Problems in relationship with spouse or partner: Secondary | ICD-10-CM | POA: Diagnosis not present

## 2017-12-23 DIAGNOSIS — F332 Major depressive disorder, recurrent severe without psychotic features: Secondary | ICD-10-CM | POA: Insufficient documentation

## 2017-12-23 DIAGNOSIS — F1414 Cocaine abuse with cocaine-induced mood disorder: Secondary | ICD-10-CM | POA: Diagnosis present

## 2017-12-23 DIAGNOSIS — E079 Disorder of thyroid, unspecified: Secondary | ICD-10-CM | POA: Diagnosis present

## 2017-12-23 DIAGNOSIS — E669 Obesity, unspecified: Secondary | ICD-10-CM | POA: Diagnosis present

## 2017-12-23 DIAGNOSIS — T39312A Poisoning by propionic acid derivatives, intentional self-harm, initial encounter: Secondary | ICD-10-CM | POA: Diagnosis present

## 2017-12-23 DIAGNOSIS — Z818 Family history of other mental and behavioral disorders: Secondary | ICD-10-CM | POA: Diagnosis not present

## 2017-12-23 DIAGNOSIS — T39392A Poisoning by other nonsteroidal anti-inflammatory drugs [NSAID], intentional self-harm, initial encounter: Secondary | ICD-10-CM | POA: Diagnosis not present

## 2017-12-23 DIAGNOSIS — Y92009 Unspecified place in unspecified non-institutional (private) residence as the place of occurrence of the external cause: Secondary | ICD-10-CM | POA: Diagnosis not present

## 2017-12-23 DIAGNOSIS — F419 Anxiety disorder, unspecified: Secondary | ICD-10-CM | POA: Diagnosis not present

## 2017-12-23 DIAGNOSIS — Z79899 Other long term (current) drug therapy: Secondary | ICD-10-CM | POA: Insufficient documentation

## 2017-12-23 DIAGNOSIS — F17210 Nicotine dependence, cigarettes, uncomplicated: Secondary | ICD-10-CM | POA: Diagnosis not present

## 2017-12-23 DIAGNOSIS — T1491XA Suicide attempt, initial encounter: Secondary | ICD-10-CM | POA: Diagnosis not present

## 2017-12-23 DIAGNOSIS — R45851 Suicidal ideations: Secondary | ICD-10-CM

## 2017-12-23 LAB — COMPREHENSIVE METABOLIC PANEL
ALT: 15 U/L (ref 14–54)
AST: 20 U/L (ref 15–41)
Albumin: 3.9 g/dL (ref 3.5–5.0)
Alkaline Phosphatase: 71 U/L (ref 38–126)
Anion gap: 10 (ref 5–15)
BILIRUBIN TOTAL: 0.6 mg/dL (ref 0.3–1.2)
BUN: 9 mg/dL (ref 6–20)
CALCIUM: 9.2 mg/dL (ref 8.9–10.3)
CO2: 22 mmol/L (ref 22–32)
CREATININE: 0.85 mg/dL (ref 0.44–1.00)
Chloride: 108 mmol/L (ref 101–111)
GFR calc Af Amer: >60 mL/min (ref >60)
Glucose, Bld: 100 mg/dL — ABNORMAL HIGH (ref 65–99)
Potassium: 3.4 mmol/L — ABNORMAL LOW (ref 3.5–5.1)
Sodium: 140 mmol/L (ref 135–145)
TOTAL PROTEIN: 7.9 g/dL (ref 6.5–8.1)

## 2017-12-23 LAB — RAPID URINE DRUG SCREEN, HOSP PERFORMED
AMPHETAMINES: NOT DETECTED
Barbiturates: NOT DETECTED
Benzodiazepines: NOT DETECTED
Cocaine: POSITIVE — AB
Opiates: NOT DETECTED
Tetrahydrocannabinol: POSITIVE — AB

## 2017-12-23 LAB — CBC
HCT: 31.8 % — ABNORMAL LOW (ref 36.0–46.0)
Hemoglobin: 10.3 g/dL — ABNORMAL LOW (ref 12.0–15.0)
MCH: 21.7 pg — AB (ref 26.0–34.0)
MCHC: 32.4 g/dL (ref 30.0–36.0)
MCV: 66.9 fL — AB (ref 78.0–100.0)
PLATELETS: 415 10*3/uL — AB (ref 150–400)
RBC: 4.75 MIL/uL (ref 3.87–5.11)
RDW: 16.8 % — AB (ref 11.5–15.5)
WBC: 10.1 10*3/uL (ref 4.0–10.5)

## 2017-12-23 LAB — I-STAT BETA HCG BLOOD, ED (MC, WL, AP ONLY)

## 2017-12-23 LAB — ETHANOL

## 2017-12-23 LAB — SALICYLATE LEVEL: Salicylate Lvl: <7 mg/dL (ref 2.8–30.0)

## 2017-12-23 LAB — ACETAMINOPHEN LEVEL: Acetaminophen (Tylenol), Serum: <10 ug/mL — ABNORMAL LOW (ref 10–30)

## 2017-12-23 MED ORDER — ONDANSETRON HCL 4 MG PO TABS: 4.0000 mg | ORAL_TABLET | Freq: Three times a day (TID) | ORAL | Status: DC | PRN

## 2017-12-23 MED ORDER — CITALOPRAM HYDROBROMIDE 10 MG PO TABS
10.0000 mg | ORAL_TABLET | Freq: Every day | ORAL | Status: DC
  Administered 2017-12-23: 10 mg via ORAL
  Filled 2017-12-23: qty 1

## 2017-12-23 MED ORDER — POTASSIUM CHLORIDE CRYS ER 20 MEQ PO TBCR
20.0000 meq | EXTENDED_RELEASE_TABLET | Freq: Once | ORAL | Status: AC
  Administered 2017-12-23: 20 meq via ORAL
  Filled 2017-12-23: qty 1

## 2017-12-23 MED ORDER — ALUM & MAG HYDROXIDE-SIMETH 200-200-20 MG/5ML PO SUSP: 30.0000 mL | Freq: Four times a day (QID) | ORAL | Status: DC | PRN

## 2017-12-23 MED ORDER — ACETAMINOPHEN 325 MG PO TABS: 650.0000 mg | ORAL_TABLET | ORAL | Status: DC | PRN

## 2017-12-23 MED ORDER — ACETAMINOPHEN 325 MG PO TABS
650.0000 mg | ORAL_TABLET | ORAL | Status: DC | PRN
  Administered 2017-12-25: 650 mg via ORAL
  Filled 2017-12-23: qty 2

## 2017-12-23 MED ORDER — CITALOPRAM HYDROBROMIDE 10 MG PO TABS
10.0000 mg | ORAL_TABLET | Freq: Every day | ORAL | Status: DC
  Administered 2017-12-24: 10 mg via ORAL
  Filled 2017-12-23 (×3): qty 1

## 2017-12-23 MED ORDER — MAGNESIUM HYDROXIDE 400 MG/5ML PO SUSP: 30.0000 mL | Freq: Every day | ORAL | Status: DC | PRN

## 2017-12-23 MED ORDER — TRAZODONE HCL 50 MG PO TABS
50.0000 mg | ORAL_TABLET | Freq: Every evening | ORAL | Status: DC | PRN
  Administered 2017-12-24: 50 mg via ORAL
  Filled 2017-12-23: qty 1
  Filled 2017-12-23: qty 7

## 2017-12-23 MED ORDER — ZOLPIDEM TARTRATE 5 MG PO TABS: 5.0000 mg | ORAL_TABLET | Freq: Every evening | ORAL | Status: DC | PRN

## 2017-12-23 MED ORDER — TRAZODONE HCL 50 MG PO TABS: 50.0000 mg | ORAL_TABLET | Freq: Every evening | ORAL | Status: DC | PRN

## 2017-12-23 NOTE — Progress Notes (Signed)
D: Pt  Passive SI-contracts for safety. denies HI/AVH. Pt is pleasant and cooperative. Pt stated she get suicidal when she gets depressed. Pt visible on the unit and brightens on approach  A: Pt was offered support and encouragement. Pt was given scheduled medications. Pt was encourage to attend groups. Q 15 minute checks were done for safety.   R:Pt attends groups and interacts well with peers and staff. Pt is taking medication. Pt has no complaints.Pt receptive to treatment and safety maintained on unit.
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
## 2017-12-23 NOTE — Tx Team (Signed)
Initial Treatment Plan 12/23/2017 6:08 PM Megan [name] JYN:[id number]    PATIENT STRESSORS: Financial difficulties Marital or family conflict Occupational concerns   PATIENT STRENGTHS: Ability for insight Average or above average intelligence Capable of independent living General fund of knowledge Motivation for treatment/growth   PATIENT IDENTIFIED PROBLEMS: Depression Suicidal thoughts "Coping skills" "Ways to deal with stress"                     DISCHARGE CRITERIA:  Ability to meet basic life and health needs Improved stabilization in mood, thinking, and/or behavior Reduction of life-threatening or endangering symptoms to within safe limits Verbal commitment to aftercare and medication compliance  PRELIMINARY DISCHARGE PLAN: Attend aftercare/continuing care group Return to previous living arrangement  PATIENT/FAMILY INVOLVEMENT: This treatment plan has been presented to and reviewed with the patient, Megan [name], and/or family member, .  The patient and family have been given the opportunity to ask questions and make suggestions.  Keshon Markovitz, [place], [place] 12/23/2017, 6:08 PM

## 2017-12-23 NOTE — ED Notes (Signed)
[facility] on unit to transfer pt to [facility] Adult unit per MD order. Personal property given to [facility] for transfer. Pt signed e-signature. Ambulatory off unit.  

## 2017-12-23 NOTE — Plan of Care (Signed)
  Problem: Activity: Goal: Sleeping patterns will improve Outcome: Progressing   Problem: Safety: Goal: Periods of time without injury will increase Outcome: Progressing   Problem: Coping: Goal: Will verbalize feelings Outcome: Progressing   

## 2017-12-23 NOTE — BH Assessment (Signed)
Assessment Note  Megan [name] is a 37 y.o. female who presented to [facility] as a voluntary walk-in with complaint of depressive symptoms, including recent suicide attempt.  She was alone.  Pt has not been assessed by TTS before.  Pt lives in [place] with her elderly mother and five children.  She works at [employer] and is earning her GED.  Pt reported that since age 78, she has struggled with depression, and that she has been hospitalized while a teenager for a suicide attempt.  Pt reported that recently she has experienced an increase in depressive symptoms, including suicidal ideation, despondency, insomnia (3 hours per night), feelings of worthlessness and hopelessness, isolation, loss of pleasure.  Pt reported that two weeks ago, she intentionally overdosed on ibuprofen and Nyquil in a self-described suicide attempt.  Pt has a history of self-injury (cutting), although she has not cut in several years.  When asked why she came in today, Pt stated that she had a ''nervous breakdown'' last night.  Stressors include work, school, and conflict with family and boyfriend.  Pt endorsed daily use of marijuana (a gram).  Pt does not receive any outpatient psychiatric or therapy services.  During assessment, Pt presented as alert and oriented.  She had good eye contact and was cooperative.  Pt was dressed in scrubs, and she appeared appropriately groomed.  Pt's mood was sad.  Affect was mood-congruent.  Pt endorsed depressive symptoms, including suicidal ideation and recent suicide attempt.  Pt's speech was normal in rate, rhythm, and volume.  Pt's thought processes were within normal range, and thought content was logical and goal-oriented.  There was no evidence of delusion.  Memory and concentration were grossly intact.  Insight and judgment were fair.  Impulse control was deemed poor due to recent suicide attempt.  Consulted with Molli Knock, DNP, who determined that Pt meets inpt  criteria.  Diagnosis: F33.2 Major Depressive Disorder, Recurrent, Severe w/o psychotic features  Past Medical History:  Past Medical History:  Diagnosis Date  . Anemia   . Obese   . Sickle cell trait (HCC)   . Thyroid disease     Past Surgical History:  Procedure Laterality Date  . CESAREAN SECTION WITH BILATERAL TUBAL LIGATION N/A 08/23/2013   Procedure: Primary Cesarean Section Delivery Baby Girl @ 2347, Apgars 8/9, Bilateral Tubal Ligation;  Surgeon: Antionette Char, MD;  Location: [facility];  Service: Obstetrics;  Laterality: N/A;  . FINGER NAIL SURGERY    . KNEE SURGERY    . WISDOM TOOTH EXTRACTION      Family History:  Family History  Problem Relation Age of Onset  . Hypertension Mother   . Diabetes Mother   . Heart disease Mother   . Sickle cell trait Mother     Social History:  reports that she quit smoking about 4 years ago. Her smoking use included cigarettes. She has never used smokeless tobacco. She reports that she has current or past drug history. Drug: Marijuana. Frequency: 7.00 times per week. She reports that she does not drink alcohol.  Additional Social History:  Alcohol / Drug Use Pain Medications: See MAR Prescriptions: See MAR Over the Counter: See MAR History of alcohol / drug use?: Yes Substance #1 Name of Substance 1: Marijuana 1 - Age of First Use: 15 1 - Amount (size/oz): 1 gram 1 - Frequency: Daily 1 - Duration: Ongoing 1 - Last Use / Amount: 12/22/17  CIWA: CIWA-Ar BP: 131/89 Pulse Rate: 83 COWS:  Allergies: No Known Allergies  Home Medications:  (Not in a hospital admission)  OB/GYN Status:  No LMP recorded.  General Assessment Data Location of Assessment: [facility] ED TTS Assessment: In system Is this a Tele or Face-to-Face Assessment?: Face-to-Face Is this an Initial Assessment or a Re-assessment for this encounter?: Initial Assessment Marital status: Long term relationship Is patient pregnant?: No Pregnancy Status: No Living  Arrangements: Children, Parent(Lives with mother, five children) Can pt return to current living arrangement?: Yes Admission Status: Voluntary Is patient capable of signing voluntary admission?: Yes Referral Source: Self/Family/Friend Insurance type: None     Crisis Care Plan Living Arrangements: Children, Parent(Lives with mother, five children) Name of Psychiatrist: None currently Name of Therapist: None currently  Education Status Is patient currently in school?: Yes Current Grade: (Earning her GED)  Risk to self with the past 6 months Suicidal Ideation: No-Not Currently/Within Last 6 Months Has patient been a risk to self within the past 6 months prior to admission? : Yes Suicidal Intent: No-Not Currently/Within Last 6 Months Has patient had any suicidal intent within the past 6 months prior to admission? : Yes Is patient at risk for suicide?: Yes Suicidal Plan?: No-Not Currently/Within Last 6 Months Has patient had any suicidal plan within the past 6 months prior to admission? : Yes Access to Means: Yes Specify Access to Suicidal Means: Pt OD'd on OTC drugs two weeks ago What has been your use of drugs/alcohol within the last 12 months?: Marijuana Previous Attempts/Gestures: Yes How many times?: 2 Other Self Harm Risks: Drug use Triggers for Past Attempts: Unknown, Unpredictable Intentional Self Injurious Behavior: Cutting Comment - Self Injurious Behavior: Pt has history of cutting Family Suicide History: No Recent stressful life event(s): Conflict (Comment)(conflict w/boyfriend, family members) Persecutory voices/beliefs?: No Depression: Yes Depression Symptoms: Despondent, Insomnia, Tearfulness, Isolating, Loss of interest in usual pleasures, Feeling worthless/self pity Substance abuse history and/or treatment for substance abuse?: Yes Suicide prevention information given to non-admitted patients: Not applicable  Risk to Others within the past 6 months Homicidal  Ideation: No Does patient have any lifetime risk of violence toward others beyond the six months prior to admission? : No Thoughts of Harm to Others: No Current Homicidal Intent: No Current Homicidal Plan: No Access to Homicidal Means: No History of harm to others?: No Assessment of Violence: None Noted Does patient have access to weapons?: No Criminal Charges Pending?: No Does patient have a court date: No Is patient on probation?: No  Psychosis Hallucinations: None noted Delusions: None noted  Mental Status Report Appearance/Hygiene: In scrubs, Unremarkable Eye Contact: Good Motor Activity: Freedom of movement, Unremarkable Speech: Logical/coherent Level of Consciousness: Alert Mood: Sad Affect: Appropriate to circumstance Anxiety Level: None Thought Processes: Coherent, Relevant Judgement: Partial Orientation: Person, Place, Time, Situation Obsessive Compulsive Thoughts/Behaviors: None  Cognitive Functioning Concentration: Normal Memory: Recent Intact, Remote Intact Is patient IDD: No Is patient DD?: No Insight: Fair Impulse Control: Poor Appetite: Poor Have you had any weight changes? : Loss Amount of the weight change? (lbs): (Pt is not sure) Sleep: Decreased Total Hours of Sleep: 3 Vegetative Symptoms: None  ADLScreening [facility] Services) Patient's cognitive ability adequate to safely complete daily activities?: Yes Patient able to express need for assistance with ADLs?: Yes Independently performs ADLs?: Yes (appropriate for developmental age)  Prior Inpatient Therapy Prior Inpatient Therapy: Yes Prior Therapy Dates: 1998 Prior Therapy Facilty/Provider(s): [facility] Reason for Treatment: Depression, SI  Prior Outpatient Therapy Prior Outpatient Therapy: No Does patient have an ACCT  team?: No Does patient have Intensive In-House Services?  : No Does patient have [facility] services? : No Does patient have P4CC services?: No  ADL Screening  (condition at time of admission) Patient's cognitive ability adequate to safely complete daily activities?: Yes Is the patient deaf or have difficulty hearing?: No Does the patient have difficulty seeing, even when wearing glasses/contacts?: No Does the patient have difficulty concentrating, remembering, or making decisions?: No Patient able to express need for assistance with ADLs?: Yes Does the patient have difficulty dressing or bathing?: No Independently performs ADLs?: Yes (appropriate for developmental age) Does the patient have difficulty walking or climbing stairs?: No Weakness of Legs: None Weakness of Arms/Hands: None  Home Assistive Devices/Equipment Home Assistive Devices/Equipment: None  Therapy Consults (therapy consults require a physician order) PT Evaluation Needed: No OT Evalulation Needed: No SLP Evaluation Needed: No Abuse/Neglect Assessment (Assessment to be complete while patient is alone) Abuse/Neglect Assessment Can Be Completed: Yes Physical Abuse: Denies Verbal Abuse: Denies Sexual Abuse: Denies Exploitation of patient/patient's resources: Denies Self-Neglect: Denies Values / Beliefs Cultural Requests During Hospitalization: None Spiritual Requests During Hospitalization: None Consults Spiritual Care Consult Needed: No Social Work Consult Needed: No Merchant navy officer (For Healthcare) Does Patient Have a Medical Advance Directive?: No    Additional Information 1:1 In Past 12 Months?: No CIRT Risk: No Elopement Risk: No Does patient have medical clearance?: Yes     Disposition:  Disposition Initial Assessment Completed for this Encounter: Yes Disposition of Patient: Admit(Per Molli Knock, DNP, Pt meets inpt criteria)  On Site Evaluation by:   Reviewed with Physician:    Dorris Fetch Demetrice Amstutz 12/23/2017 9:02 AM

## 2017-12-23 NOTE — ED Notes (Signed)
Bed: [facility] Expected date:  Expected time:  Means of arrival:  Comments: 

## 2017-12-23 NOTE — ED Notes (Signed)
Pt talking on hallway phone.  

## 2017-12-23 NOTE — ED Notes (Signed)
Bed: [facility] Expected date:  Expected time:  Means of arrival:  Comments: 

## 2017-12-23 NOTE — Progress Notes (Signed)
Patient admitted voluntarily to [facility] hall.  Patient denied SI and HI, contracts for safety.  Denied A/V hallucinations.  Denied pain.  Respirations even and unlabored.  No signs/symptoms of pain/distress noted on patient's face/body movements.  Safety maintained with 15 minute checks. Patient oriented to unit and given food/drink. Patient has been cooperative and alert.

## 2017-12-23 NOTE — ED Provider Notes (Signed)
[facility]-EMERGENCY DEPT Provider Note   CSN: [id number] Arrival date & time: 12/23/17  0430     History   Chief Complaint Chief Complaint  Patient presents with  . Suicidal    HPI Megan [name] is a 37 y.o. female with a history of anemia and sickle cell trait who presents to the ED with SI requiring medical clearance. Patient states she has felt depressed/sad for several months now, states this has seemed to worsen over past 1 month. She reports having thoughts of suicide, she states she has made a few attempts in the past- most recent 2-3 weeks.  She states that she tried overdosing on Advil.  She has not done anything similar to this in the past 48 hours.  She states that she has attempted suicide in front of her children.  She states she is coming in because she wants to get better.  She has required previous psychiatric hospitalizations.  Denies HI, hallucinations, chest pain, shortness of breath, abdominal pain, vomiting, diarrhea, or fever.  HPI  Past Medical History:  Diagnosis Date  . Anemia   . Obese   . Sickle cell trait (HCC)   . Thyroid disease     Patient Active Problem List   Diagnosis Date Noted  . Cesarean delivery delivered 08/24/2013  . Maternal anemia complicating pregnancy, childbirth, or the puerperium 08/24/2013  . Asymptomatic bacteriuria in pregnancy 08/24/2013  . Normal delivery at term 08/23/2013  . Fetal heart rate decelerations affecting management of mother 08/23/2013  . Gestational hypertension 08/19/2013  . Obesity complicating pregnancy in third trimester 08/19/2013  . Grand multipara 08/19/2013    Past Surgical History:  Procedure Laterality Date  . CESAREAN SECTION WITH BILATERAL TUBAL LIGATION N/A 08/23/2013   Procedure: Primary Cesarean Section Delivery Baby Girl @ 2347, Apgars 8/9, Bilateral Tubal Ligation;  Surgeon: Antionette Char, MD;  Location: [facility];  Service: Obstetrics;  Laterality: N/A;  .  FINGER NAIL SURGERY    . KNEE SURGERY    . WISDOM TOOTH EXTRACTION       OB History    Gravida  5   Para  5   Term  4   Preterm  1   AB      Living  5     SAB      TAB      Ectopic      Multiple      Live Births  5            Home Medications    Prior to Admission medications   Medication Sig Start Date End Date Taking? Authorizing Provider  cephALEXin (KEFLEX) 500 MG capsule Take 1 capsule (500 mg total) by mouth 2 (two) times daily. 08/14/15   Sam, Ace Gins, PA-C  ibuprofen (ADVIL,MOTRIN) 600 MG tablet Take 1 tablet (600 mg total) by mouth every 6 (six) hours as needed for mild pain. Patient not taking: Reported on 07/24/2015 08/27/13   Brock Bad, MD  Iron-FA-B Cmp-C-Biot-Probiotic (FUSION PLUS) CAPS Take 1 capsule by mouth daily before breakfast. Patient not taking: Reported on 07/24/2015 08/27/13   Brock Bad, MD  loperamide (IMODIUM) 2 MG capsule Take 1 capsule (2 mg total) by mouth 4 (four) times daily as needed for diarrhea or loose stools. 07/24/15   Lawyer, Cristal Deer, PA-C  metroNIDAZOLE (FLAGYL) 500 MG tablet Take 1 tablet (500 mg total) by mouth 2 (two) times daily. 08/14/15   Sam, Ace Gins, PA-C  nitrofurantoin,  macrocrystal-monohydrate, (MACROBID) 100 MG capsule Take 1 capsule (100 mg total) by mouth 2 (two) times daily. Patient not taking: Reported on 07/24/2015 08/19/13   Brock Bad, MD  oxyCODONE-acetaminophen (PERCOCET/ROXICET) 5-325 MG per tablet Take 1-2 tablets by mouth every 4 (four) hours as needed for severe pain (moderate - severe pain). Patient not taking: Reported on 07/24/2015 08/27/13   Brock Bad, MD  Prenatal Vit-Fe Fumarate-FA (PRENATAL MULTIVITAMIN) TABS tablet Take 1 tablet by mouth daily at 12 noon.    [provider]  promethazine (PHENERGAN) 25 MG tablet Take 1 tablet (25 mg total) by mouth every 8 (eight) hours as needed for nausea or vomiting. 07/24/15   Charlestine Night, PA-C     Family History Family History  Problem Relation Age of Onset  . Hypertension Mother   . Diabetes Mother   . Heart disease Mother   . Sickle cell trait Mother     Social History Social History   Tobacco Use  . Smoking status: Former Smoker    Types: Cigarettes    Last attempt to quit: 01/01/2013    Years since quitting: 4.9  . Smokeless tobacco: Never Used  Substance Use Topics  . Alcohol use: No  . Drug use: No     Allergies   Patient has no known allergies.   Review of Systems Review of Systems  Constitutional: Negative for chills and fever.  Respiratory: Negative for shortness of breath.   Cardiovascular: Negative for chest pain.  Gastrointestinal: Negative for abdominal pain, diarrhea, nausea and vomiting.  Psychiatric/Behavioral: Positive for self-injury and suicidal ideas. Negative for hallucinations.  All other systems reviewed and are negative.    Physical Exam Updated Vital Signs BP 123/78 (BP Location: Left Arm)   Pulse (!) 107   Temp 98.2 F (36.8 C) (Oral)   Resp 14   Ht  (1.6 m)   Wt 117.9 kg (260 lb)   SpO2 99%   BMI 46.06 kg/m   Physical Exam  Constitutional: She appears well-developed and well-nourished. No distress.  HENT:  Head: Normocephalic and atraumatic.  Eyes: Conjunctivae are normal. Right eye exhibits no discharge. Left eye exhibits no discharge.  Cardiovascular: Normal rate and regular rhythm.  No murmur heard. Pulmonary/Chest: Breath sounds normal. No respiratory distress. She has no wheezes. She has no rales.  Abdominal: Soft. She exhibits no distension. There is no tenderness.  Neurological: She is alert.  Clear speech.   Skin: Skin is warm and dry. No rash noted.  Psychiatric: She has a normal mood and affect. Her speech is normal and behavior is normal. She is not actively hallucinating. She expresses suicidal ideation. She expresses no homicidal ideation. She expresses suicidal plans. She expresses no homicidal  plans.  Nursing note and vitals reviewed.    ED Treatments / Results  Labs Results for orders placed or performed during the hospital encounter of 12/23/17  Comprehensive metabolic panel  Result Value Ref Range   Sodium 140 135 - 145 mmol/L   Potassium 3.4 (L) 3.5 - 5.1 mmol/L   Chloride 108 101 - 111 mmol/L   CO2 22 22 - 32 mmol/L   Glucose, Bld 100 (H) 65 - 99 mg/dL   BUN 9 6 - 20 mg/dL   Creatinine, Ser 5.36 0.44 - 1.00 mg/dL   Calcium 9.2 8.9 - 64.4 mg/dL   Total Protein 7.9 6.5 - 8.1 g/dL   Albumin 3.9 3.5 - 5.0 g/dL   AST 20 15 - 41 U/L  ALT 15 14 - 54 U/L   Alkaline Phosphatase 71 38 - 126 U/L   Total Bilirubin 0.6 0.3 - 1.2 mg/dL   GFR calc non Af Amer >60 >60 mL/min   GFR calc Af Amer >60 >60 mL/min   Anion gap 10 5 - 15  Ethanol  Result Value Ref Range   Alcohol, Ethyl (B) <10 <10 mg/dL  Salicylate level  Result Value Ref Range   Salicylate Lvl <7.0 2.8 - 30.0 mg/dL  Acetaminophen level  Result Value Ref Range   Acetaminophen (Tylenol), Serum <10 (L) 10 - 30 ug/mL  cbc  Result Value Ref Range   WBC 10.1 4.0 - 10.5 K/uL   RBC 4.75 3.87 - 5.11 MIL/uL   Hemoglobin 10.3 (L) 12.0 - 15.0 g/dL   HCT 16.1 (L) 09.6 - 04.5 %   MCV 66.9 (L) 78.0 - 100.0 fL   MCH 21.7 (L) 26.0 - 34.0 pg   MCHC 32.4 30.0 - 36.0 g/dL   RDW 40.9 (H) 81.1 - 91.4 %   Platelets 415 (H) 150 - 400 K/uL  I-Stat beta hCG blood, ED  Result Value Ref Range   I-stat hCG, quantitative <5.0 <5 mIU/mL   Comment 3           No results found. EKG None  Radiology No results found.  Procedures Procedures (including critical care time)  Medications Ordered in ED Medications  potassium chloride SA (K-DUR,KLOR-CON) CR tablet 20 mEq (has no administration in time range)  acetaminophen (TYLENOL) tablet 650 mg (has no administration in time range)  zolpidem (AMBIEN) tablet 5 mg (has no administration in time range)  ondansetron (ZOFRAN) tablet 4 mg (has no administration in time range)  alum  & mag hydroxide-simeth (MAALOX/MYLANTA) 200-200-20 MG/5ML suspension 30 mL (has no administration in time range)     Initial Impression / Assessment and Plan / ED Course  I have reviewed the triage vital signs and the nursing notes.  Pertinent labs & imaging results that were available during my care of the patient were reviewed by me and considered in my medical decision making (see chart for details).   Patient presents with suicidal ideation with plan and previous attempts requiring medical clearance.  Patient is nontoxic-appearing, in no apparent distress, initial vitals notable for tachycardia which normalized upon my evaluation and with repeat vital signs.  Screening labs have been reviewed.  Patient's hemoglobin of 10.3 is baseline.  She has mild hypokalemia at 3.4- given one time oral replacement in the ER. Patient is medically cleared for TTS evaluation. Disposition per behavioral health.   Vitals:   12/23/17 0439 12/23/17 0725  BP: 123/78 131/89  Pulse: (!) 107 83  Resp: 14 16  Temp: 98.2 F (36.8 C)   SpO2: 99% 100%   Upon review of TTS note patient has been recommended for admission.   Final Clinical Impressions(s) / ED Diagnoses   Final diagnoses:  Suicidal ideation    ED Discharge Orders    None       Desmond Lope 12/23/17 1208    Palumbo, April, MD 12/23/17 2310

## 2017-12-23 NOTE — ED Triage Notes (Signed)
Pt reports SI that started about a month ago. She reports that she is under a lot of stress and has 5 babies at home. A&Ox4. Ambulatory. Plan is to OD on advil.

## 2017-12-23 NOTE — Progress Notes (Signed)
Adult Psychoeducational Group Note  Date:  12/23/2017 Time:  9:45 PM  Group Topic/Focus:  Wrap-Up Group:   The focus of this group is to help patients review their daily goal of treatment and discuss progress on daily workbooks.  Participation Level:  Active  Participation Quality:  Appropriate  Affect:  Appropriate  Cognitive:  Alert and Oriented  Insight: Good  Engagement in Group:  Engaged  Modes of Intervention:  Discussion  Additional Comments:  Pt attended group this evening.  Leo Grosser 12/23/2017, 9:45 PM

## 2017-12-23 NOTE — ED Notes (Signed)
Pt is aware a urine sample is needed. 

## 2017-12-23 NOTE — ED Notes (Signed)
One bag of belongings containing cell phone, wallet, and clothing in cabinet in triage.

## 2017-12-23 NOTE — ED Notes (Signed)
Pt admitted to room #37. Pt behavior calm and cooperative, pleasant on approach. Pt reports "I had a nervous breakdown last night." Pt identifies current stressors as a single mother of 5 children and her mother. Pt endorsing passive SI. Denies AVH. Reports hx of depression, but has been off medication for 20 years. Encouragement and support provided. Special checks q 15 mins in place for safety, Video monitoring in place. Will continue to monitor.

## 2017-12-24 DIAGNOSIS — Z6379 Other stressful life events affecting family and household: Secondary | ICD-10-CM

## 2017-12-24 DIAGNOSIS — F17210 Nicotine dependence, cigarettes, uncomplicated: Secondary | ICD-10-CM

## 2017-12-24 DIAGNOSIS — F411 Generalized anxiety disorder: Secondary | ICD-10-CM

## 2017-12-24 DIAGNOSIS — F1420 Cocaine dependence, uncomplicated: Secondary | ICD-10-CM | POA: Diagnosis present

## 2017-12-24 DIAGNOSIS — Z630 Problems in relationship with spouse or partner: Secondary | ICD-10-CM

## 2017-12-24 DIAGNOSIS — F322 Major depressive disorder, single episode, severe without psychotic features: Principal | ICD-10-CM

## 2017-12-24 DIAGNOSIS — T1491XA Suicide attempt, initial encounter: Secondary | ICD-10-CM

## 2017-12-24 DIAGNOSIS — T39312A Poisoning by propionic acid derivatives, intentional self-harm, initial encounter: Secondary | ICD-10-CM

## 2017-12-24 MED ORDER — CITALOPRAM HYDROBROMIDE 10 MG PO TABS
10.0000 mg | ORAL_TABLET | Freq: Every day | ORAL | Status: DC
  Administered 2017-12-25: 10 mg via ORAL
  Filled 2017-12-24: qty 7
  Filled 2017-12-24 (×3): qty 1

## 2017-12-24 NOTE — BHH Suicide Risk Assessment (Signed)
[facility] Admission Suicide Risk Assessment   Nursing information obtained from:    Demographic factors:    Current Mental Status:    Loss Factors:    Historical Factors:    Risk Reduction Factors:     Total Time spent with patient: 45 minutes Principal Problem: <principal problem not specified> Diagnosis:   Patient Active Problem List   Diagnosis Date Noted  . 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 Cesarean delivery delivered [O82] 08/24/2013  . Maternal anemia complicating pregnancy, childbirth, or the puerperium [IMO0002] 08/24/2013  . Asymptomatic bacteriuria in pregnancy [O99.89, R82.71] 08/24/2013  . Normal delivery at term [O80] 08/23/2013  . Fetal heart rate decelerations affecting management of mother [H84.6962] 08/23/2013  . Gestational hypertension [O13.9] 08/19/2013  . Obesity complicating pregnancy in third trimester [O99.213] 08/19/2013  . Grand multipara [Z64.1] 08/19/2013   Subjective Data: Patient is seen and examined.  Patient is a 37 year old female with a probable psychiatric diagnosis of major depression as well as generalized anxiety disorder who presented to the most common emergency department after an intentional overdose on Advil.  Patient stated that she got into an argument with her boyfriend.  She apparently has been arguing with him for more than a couple of weeks.  She got frustrated with all of this.  She picked up a baseball bat and considered hitting him, but did not.  She went off walking off by her self cool off.  She returned home, and then went to the emergency room for evaluation.  She admitted that she been smoking marijuana as well as using cocaine.  She had been sober from cocaine for several years, but used prior to her most recent argument with her boyfriend.  She is under a significant degree of stress at home.  Her mother and her mother's boyfriend as well as 5 children live in the home with her.   She works at [employer] near the hospital.  She is trying to support all of them at this time.  She also has a court date on Wednesday regarding custody of 1 of her children.  She admitted to a previous psychiatric hospitalization as an adolescent, but none since then.  She admitted to helplessness, hopelessness and worthlessness.  She has a history of sickle cell trait, some anemia, and unspecified thyroid disease that worsens with pregnancy.  She was admitted to the hospital for evaluation and stabilization.  Continued Clinical Symptoms:  Alcohol Use Disorder Identification Test Final Score (AUDIT): 1 The "Alcohol Use Disorders Identification Test", Guidelines for Use in Primary Care, Second Edition.  World Science writer [facility]). Score between 0-7:  no or low risk or alcohol related problems. Score between 8-15:  moderate risk of alcohol related problems. Score between 16-19:  high risk of alcohol related problems. Score 20 or above:  warrants further diagnostic evaluation for alcohol dependence and treatment.   CLINICAL FACTORS:   Depression:   Anhedonia Hopelessness Impulsivity Insomnia Alcohol/Substance Abuse/Dependencies   Musculoskeletal: Strength & Muscle Tone: within normal limits Gait & Station: normal Patient leans: N/A  Psychiatric Specialty Exam: Physical Exam  Nursing note and vitals reviewed. Constitutional: She is oriented to person, place, and time. She appears well-developed and well-nourished.  HENT:  Head: Normocephalic and atraumatic.  Respiratory: Effort normal.  Neurological: She is oriented to person, place, and time.    ROS  Blood pressure 127/80, pulse 85, temperature 98.1 F (36.7 C), temperature source Oral, resp. rate 18, height   (1.6 m), weight 108.9 kg (240 lb), currently breastfeeding.Body mass index is 42.51 kg/m.  General Appearance: Disheveled  Eye Contact:  Fair  Speech:  Normal Rate  Volume:  Normal  Mood:  Depressed  Affect:   Congruent  Thought Process:  Coherent  Orientation:  Full (Time, Place, and Person)  Thought Content:  Logical  Suicidal Thoughts:  No  Homicidal Thoughts:  No  Memory:  Immediate;   Fair  Judgement:  Fair  Insight:  Fair  Psychomotor Activity:  Increased  Concentration:  Concentration: Good  Recall:  Fair  Fund of Knowledge:  Fair  Language:  Good  Akathisia:  No  Handed:  Right  AIMS (if indicated):     Assets:  Communication Skills Desire for Improvement Financial Resources/Insurance Housing Resilience Social Support Talents/Skills  ADL's:  Intact  Cognition:  WNL  Sleep:  Number of Hours: 6.75      COGNITIVE FEATURES THAT CONTRIBUTE TO RISK:  None    SUICIDE RISK:   Minimal: No identifiable suicidal ideation.  Patients presenting with no risk factors but with morbid ruminations; may be classified as minimal risk based on the severity of the depressive symptoms  PLAN OF CARE: Patient seen and examined.  Patient is a 37 year old female with a probable past psychiatric history significant for major depression as well as generalized anxiety disorder.  She also has a cannabis use disorder and relapsed recently on cocaine.  She took an intentional overdose of Advil, but came to the hospital.  She is been evaluated and admitted to the psychiatric unit.  She admits to helplessness, hopelessness and worthlessness.  She did have suicidal ideation.  She is been started on Celexa 10 mg a day.  She stated that is making her mildly drowsy, so will change that at bedtime.  She also denies suicidality currently.  She has a court date on Wednesday, May 1, and will have to be there for a custody issue with regarding 1 of her children.  She is remorseful about her event and sees it as being as stupid move.  I think she understands that.  She will be admitted to the unit.  She will be integrated into the milieu.  She will attend groups.  I have encouraged her to work on her coping skills  especially with regard to her mother and her mother's boyfriend.  She also has a history of sickle cell trait, and a mild anemia.  I am going to run anemia labs on her in the a.m. to make sure we do not give her iron for anemia that does not require it.  I certify that inpatient services furnished can reasonably be expected to improve the patient's condition.   Antonieta Pert, MD 12/24/2017, 1:52 PM

## 2017-12-24 NOTE — Progress Notes (Signed)
Adult Psychoeducational Group Note  Date:  12/24/2017 Time:  1:28 PM  Group Topic/Focus:  Goals Group:   The focus of this group is to help patients establish daily goals to achieve during treatment and discuss how the patient can incorporate goal setting into their daily lives to aide in recovery.  Participation Level:  Active  Participation Quality:  Appropriate  Affect:  Appropriate  Cognitive:  Alert  Insight: Appropriate  Engagement in Group:  Engaged  Modes of Intervention:  Discussion  Additional Comments:  Pt attended group and participated in group discussions.  Latanya Hemmer R Caetano Oberhaus 12/24/2017, 1:28 PM

## 2017-12-24 NOTE — Progress Notes (Addendum)
Patient ID: Megan [name], female   DOB: 05-Dec-1980, 37 y.o.   MRN: [id number]  Pt currently presents with a bright affect and impulsive, cooperative behavior. Pt seen interacting with peers positively, steps in front of and speaks over others at times. Pt reports to Clinical research associate that their goal is to "figure out my discharge plans with the social worker tomorrow." Pt states "I am going to see a counselor." Pt initially refuses nighttime medication, then requests it as she is having difficulty falling asleep.  Pt provided with medications per providers orders. Pt's labs and vitals were monitored throughout the night. Pt given a 1:1 about emotional and mental status. Pt supported and encouraged to express concerns and questions. Pt educated on medications and the importance of sleep physiologically. Celexa dose held tonight as patient received dose this morning. Will take dose at nighttime tomorrow.  Pt's safety ensured with 15 minute and environmental checks. Pt currently denies SI/HI and A/V hallucinations. Pt verbally agrees to seek staff if SI/HI or A/VH occurs and to consult with staff before acting on any harmful thoughts. Will continue POC.

## 2017-12-24 NOTE — Tx Team (Signed)
Interdisciplinary Treatment and Diagnostic Plan Update  12/24/2017 Time of Session: 9:45am Megan [name] MRN: [id number]  Principal Diagnosis: <principal problem not specified>  Secondary Diagnoses: Active Problems:   Major depressive disorder, single episode, severe without psychosis ([place])   Current Medications:  Current Facility-Administered Medications  Medication Dose Route Frequency Provider Last Rate Last Dose  . acetaminophen (TYLENOL) tablet 650 mg  650 mg Oral Q4H PRN Patrecia Pour, NP      . alum & mag hydroxide-simeth (MAALOX/MYLANTA) 200-200-20 MG/5ML suspension 30 mL  30 mL Oral Q6H PRN Patrecia Pour, NP      . citalopram (CELEXA) tablet 10 mg  10 mg Oral Daily Patrecia Pour, NP   10 mg at 12/24/17 7846  . magnesium hydroxide (MILK OF MAGNESIA) suspension 30 mL  30 mL Oral Daily PRN Patrecia Pour, NP      . ondansetron [facility]) tablet 4 mg  4 mg Oral Q8H PRN Patrecia Pour, NP      . traZODone (DESYREL) tablet 50 mg  50 mg Oral QHS PRN Patrecia Pour, NP       PTA Medications: No medications prior to admission.    Patient Stressors: Financial difficulties Marital or family conflict Occupational concerns  Patient Strengths: Ability for insight Average or above average intelligence Capable of independent living [employer] of knowledge Motivation for treatment/growth  Treatment Modalities: Medication Management, Group therapy, Case management,  1 to 1 session with clinician, Psychoeducation, Recreational therapy.   Physician Treatment Plan for Primary Diagnosis: <principal problem not specified> Long Term Goal(s):     Short Term Goals:    Medication Management: Evaluate patient's response, side effects, and tolerance of medication regimen.  Therapeutic Interventions: 1 to 1 sessions, Unit Group sessions and Medication administration.  Evaluation of Outcomes: Not Met  Physician Treatment Plan for Secondary Diagnosis: Active Problems:    Major depressive disorder, single episode, severe without psychosis ([place])  Long Term Goal(s):     Short Term Goals:       Medication Management: Evaluate patient's response, side effects, and tolerance of medication regimen.  Therapeutic Interventions: 1 to 1 sessions, Unit Group sessions and Medication administration.  Evaluation of Outcomes: Not Met   RN Treatment Plan for Primary Diagnosis: <principal problem not specified> Long Term Goal(s): Knowledge of disease and therapeutic regimen to maintain health will improve  Short Term Goals: Ability to remain free from injury will improve, Ability to verbalize frustration and anger appropriately will improve, Ability to demonstrate self-control, Ability to participate in decision making will improve, Ability to verbalize feelings will improve, Ability to disclose and discuss suicidal ideas, Ability to identify and develop effective coping behaviors will improve and Compliance with prescribed medications will improve  Medication Management: RN will administer medications as ordered by provider, will assess and evaluate patient's response and provide education to patient for prescribed medication. RN will report any adverse and/or side effects to prescribing provider.  Therapeutic Interventions: 1 on 1 counseling sessions, Psychoeducation, Medication administration, Evaluate responses to treatment, Monitor vital signs and CBGs as ordered, Perform/monitor CIWA, COWS, AIMS and Fall Risk screenings as ordered, Perform wound care treatments as ordered.  Evaluation of Outcomes: Not Met   LCSW Treatment Plan for Primary Diagnosis: <principal problem not specified> Long Term Goal(s): Safe transition to appropriate next level of care at discharge, Engage patient in therapeutic group addressing interpersonal concerns.  Short Term Goals: Engage patient in aftercare planning with referrals and resources, Increase social support,  Increase ability to  appropriately verbalize feelings, Increase emotional regulation, Facilitate acceptance of mental health diagnosis and concerns, Facilitate patient progression through stages of change regarding substance use diagnoses and concerns, Identify triggers associated with mental health/substance abuse issues and Increase skills for wellness and recovery  Therapeutic Interventions: Assess for all discharge needs, 1 to 1 time with Social worker, Explore available resources and support systems, Assess for adequacy in community support network, Educate family and significant other(s) on suicide prevention, Complete Psychosocial Assessment, Interpersonal group therapy.  Evaluation of Outcomes: Not Met   Progress in Treatment: Attending groups: Yes. Participating in groups: Yes. Taking medication as prescribed: Yes. Toleration medication: Yes. Family/Significant other contact made: No, will contact:  patient refusing consent at this time Patient understands diagnosis: Yes. Discussing patient identified problems/goals with staff: Yes. Medical problems stabilized or resolved: Yes. Denies suicidal/homicidal ideation: Yes. Issues/concerns per patient self-inventory: No. Other:   New problem(s) identified: None   New Short Term/Long Term Goal(s):medication stabilization, elimination of SI thoughts, development of comprehensive mental wellness plan.   Patient Goals: "I want to learn how to cope with my stress and handle my anger better"  Discharge Plan or Barriers: Patient plans to return home with her children and follow up with [facility] for medication management and [place] for therapy services.   Reason for Continuation of Hospitalization: Anxiety Depression Medication stabilization  Estimated Length of Stay: Friday, Dec 28, 2017  Attendees: Patient: Megan [name] 12/24/2017 11:33 AM  Physician: Dr. Myles Lipps, MD 12/24/2017 11:33 AM  Nursing: [place], [place] 12/24/2017 11:33 AM   RN Care Manager: Rhunette Croft 12/24/2017 11:33 AM  Social Worker: Radonna Ricker, [place] 12/24/2017 11:33 AM  Recreational Therapist: Rhunette Croft 12/24/2017 11:33 AM  Other: X 12/24/2017 11:33 AM  Other: X 12/24/2017 11:33 AM  Other:X 12/24/2017 11:33 AM    Scribe for Treatment Team: Marylee Floras, [place] 12/24/2017 11:33 AM

## 2017-12-24 NOTE — Plan of Care (Signed)
°  Problem: Education: °Goal: Emotional status will improve °Outcome: Progressing °Goal: Mental status will improve °Outcome: Progressing °  °Problem: Activity: °Goal: Interest or engagement in activities will improve °Outcome: Progressing °  °
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
## 2017-12-24 NOTE — H&P (Signed)
Psychiatric Admission Assessment Adult  Patient Identification: Megan [name] MRN:  [id number] Date of Evaluation:  12/24/2017 Chief Complaint:  MDD recurrent severe without psychotic features Principal Diagnosis: <principal problem not specified> Diagnosis:   Patient Active Problem List   Diagnosis Date Noted  . Major depressive disorder, single episode, severe without psychosis ([place]) [F32.2] 12/23/2017  . Cesarean delivery delivered [O82] 08/24/2013  . Maternal anemia complicating pregnancy, childbirth, or the puerperium [IMO0002] 08/24/2013  . Asymptomatic bacteriuria in pregnancy [O99.89, R82.71] 08/24/2013  . Normal delivery at term [O80] 08/23/2013  . Fetal heart rate decelerations affecting management of mother [Y78.2956] 08/23/2013  . Gestational hypertension [O13.9] 08/19/2013  . Obesity complicating pregnancy in third trimester [O99.213] 08/19/2013  . [place] multipara [Z64.1] 08/19/2013   History of Present Illness: Patient is seen and examined.  Patient is a 37 year old female with a probable past psychiatric history significant for major depression as well as generalized anxiety disorder.  She presented to the [facility] emergency department after an intentional overdose of Advil.  She had gotten into an argument with her boyfriend.  These arguments of been going on for the last couple of weeks.  She had additional stressors of taking care of her 5 children, and her mother and her mother's boyfriend live in the same home.  She also has a court date on Wednesday for custody issues of 1 of her children.  She got upset by the argument, and picked up a baseball bat.  Likely she did not harm anyone with that.  She went out for a walk for a couple hours and realized that what was going on was not a good thing.  She admitted to frequent marijuana use, and had been clean of cocaine for several years but used a couple hours prior to her an argument with her boyfriend.  She was seen in the  emergency room and felt to be dangerous to self.  She was transferred to our facility for evaluation and stabilization.  She is not on any current medication for psychiatric reasons, not seeing a psychiatrist.  She had one previous hospitalization here as an adolescent. Associated Signs/Symptoms: Depression Symptoms:  depressed mood, anhedonia, insomnia, psychomotor agitation, psychomotor retardation, fatigue, feelings of worthlessness/guilt, difficulty concentrating, hopelessness, suicidal thoughts without plan, anxiety, loss of energy/fatigue, disturbed sleep, (Hypo) Manic Symptoms:  Impulsivity, Anxiety Symptoms:  Excessive Worry, Psychotic Symptoms:  Denied PTSD Symptoms: Negative Total Time spent with patient: 45 minutes  Past Psychiatric History: Patient has one previous psychiatric admission as an adolescent at the behavioral health hospital.  No current psychiatric treatment, no current psychiatric medication.  Is the patient at risk to self? No.  Has the patient been a risk to self in the past 6 months? No.  Has the patient been a risk to self within the distant past? No.  Is the patient a risk to others? No.  Has the patient been a risk to others in the past 6 months? No.  Has the patient been a risk to others within the distant past? No.   Prior Inpatient Therapy:   Prior Outpatient Therapy:    Alcohol Screening: 1. How often do you have a drink containing alcohol?: Monthly or less 2. How many drinks containing alcohol do you have on a typical day when you are drinking?: 1 or 2 3. How often do you have six or more drinks on one occasion?: Never AUDIT-C Score: 1 4. How often during the last year have you found that you  were not able to stop drinking once you had started?: Never 5. How often during the last year have you failed to do what was normally expected from you becasue of drinking?: Never 6. How often during the last year have you needed a first drink in the  morning to get yourself going after a heavy drinking session?: Never 7. How often during the last year have you had a feeling of guilt of remorse after drinking?: Never 8. How often during the last year have you been unable to remember what happened the night before because you had been drinking?: Never 9. Have you or someone else been injured as a result of your drinking?: No 10. Has a relative or friend or a doctor or another health worker been concerned about your drinking or suggested you cut down?: No Alcohol Use Disorder Identification Test Final Score (AUDIT): 1 Intervention/Follow-up: AUDIT Score <7 follow-up not indicated Substance Abuse History in the last 12 months:  Yes.   Consequences of Substance Abuse: Negative Previous Psychotropic Medications: No  Psychological Evaluations: Yes  Past Medical History:  Past Medical History:  Diagnosis Date  . Anemia   . Obese   . Sickle cell trait ([place])   . Thyroid disease     Past Surgical History:  Procedure Laterality Date  . CESAREAN SECTION WITH BILATERAL TUBAL LIGATION N/A 08/23/2013   Procedure: Primary Cesarean Section Delivery Baby Girl @ 2347, Apgars 8/9, Bilateral Tubal Ligation;  Surgeon: Lahoma Crocker, MD;  Location: [facility];  Service: Obstetrics;  Laterality: N/A;  . FINGER NAIL SURGERY    . KNEE SURGERY    . WISDOM TOOTH EXTRACTION     Family History:  Family History  Problem Relation Age of Onset  . Hypertension Mother   . Diabetes Mother   . Heart disease Mother   . Sickle cell trait Mother    Family Psychiatric  History: Mother is an alcoholic, and also sounds like she is depressed. Tobacco Screening: Have you used any form of tobacco in the last 30 days? (Cigarettes, Smokeless Tobacco, Cigars, and/or Pipes): Yes Tobacco use, Select all that apply: 4 or less cigarettes per day Are you interested in Tobacco Cessation Medications?: No, patient refused Counseled patient on smoking cessation including  recognizing danger situations, developing coping skills and basic information about quitting provided: Refused/Declined practical counseling Social History:  Social History   Substance and Sexual Activity  Alcohol Use No     Social History   Substance and Sexual Activity  Drug Use Yes  . Frequency: 7.0 times per week  . Types: Marijuana   Comment: 1 gram per day    Additional Social History: Marital status: Long term relationship Long term relationship, how long?: 1 year What types of issues is patient dealing with in the relationship?: Patient reports her current boyfriend is not supportive.  Are you sexually active?: Yes What is your sexual orientation?: Heterosexual  Has your sexual activity been affected by drugs, alcohol, medication, or emotional stress?: No Does patient have children?: Yes How many children?: 5 How is patient's relationship with their children?: Patient reports having a "great" relationship with her 5 children.                          Allergies:  No Known Allergies Lab Results:  Results for orders placed or performed during the hospital encounter of 12/23/17 (from the past 48 hour(s))  Rapid urine drug screen (hospital performed)  Status: Abnormal   Collection Time: 12/23/17  5:20 AM  Result Value Ref Range   Opiates NONE DETECTED NONE DETECTED   Cocaine POSITIVE (A) NONE DETECTED   Benzodiazepines NONE DETECTED NONE DETECTED   Amphetamines NONE DETECTED NONE DETECTED   Tetrahydrocannabinol POSITIVE (A) NONE DETECTED   Barbiturates NONE DETECTED NONE DETECTED    Comment: (NOTE) DRUG SCREEN FOR MEDICAL PURPOSES ONLY.  IF CONFIRMATION IS NEEDED FOR ANY PURPOSE, NOTIFY LAB WITHIN 5 DAYS. LOWEST DETECTABLE LIMITS FOR URINE DRUG SCREEN Drug Class                     Cutoff (ng/mL) Amphetamine and metabolites    1000 Barbiturate and metabolites    200 Benzodiazepine                 676 Tricyclics and metabolites     300 Opiates and  metabolites        300 Cocaine and metabolites        300 THC                            50 Performed at [facility], [place] [address] [place], [place] [address]   Comprehensive metabolic panel     Status: Abnormal   Collection Time: 12/23/17  6:16 AM  Result Value Ref Range   Sodium 140 135 - 145 mmol/L   Potassium 3.4 (L) 3.5 - 5.1 mmol/L   Chloride 108 101 - 111 mmol/L   CO2 22 22 - 32 mmol/L   Glucose, Bld 100 (H) 65 - 99 mg/dL   BUN 9 6 - 20 mg/dL   Creatinine, Ser 0.85 0.44 - 1.00 mg/dL   Calcium 9.2 8.9 - 10.3 mg/dL   Total Protein 7.9 6.5 - 8.1 g/dL   Albumin 3.9 3.5 - 5.0 g/dL   AST 20 15 - 41 U/L   ALT 15 14 - 54 U/L   Alkaline Phosphatase 71 38 - 126 U/L   Total Bilirubin 0.6 0.3 - 1.2 mg/dL   GFR calc non Af Amer >60 >60 mL/min   GFR calc Af Amer >60 >60 mL/min    Comment: (NOTE) The eGFR has been calculated using the CKD EPI equation. This calculation has not been validated in all clinical situations. eGFR's persistently <60 mL/min signify possible Chronic Kidney Disease.    Anion gap 10 5 - 15    Comment: Performed at [facility], [place] [address], [place], [place] [address]  Ethanol     Status: None   Collection Time: 12/23/17  6:16 AM  Result Value Ref Range   Alcohol, Ethyl (B) <10 <10 mg/dL    Comment:        LOWEST DETECTABLE LIMIT FOR SERUM ALCOHOL IS 10 mg/dL FOR MEDICAL PURPOSES ONLY Performed at [place] [address], [place], [place] [address]   Salicylate level     Status: None   Collection Time: 12/23/17  6:16 AM  Result Value Ref Range   Salicylate Lvl <9.4 2.8 - 30.0 mg/dL    Comment: Performed at [facility], [place] [address], [place], [place] [address]  Acetaminophen level     Status: Abnormal   Collection Time: 12/23/17  6:16 AM  Result Value Ref Range   Acetaminophen (Tylenol), Serum <10 (L) 10 - 30 ug/mL    Comment:        THERAPEUTIC CONCENTRATIONS  VARY SIGNIFICANTLY. A RANGE OF 10-30 ug/mL MAY BE AN EFFECTIVE CONCENTRATION FOR MANY PATIENTS. HOWEVER, SOME ARE BEST TREATED AT CONCENTRATIONS OUTSIDE THIS RANGE. ACETAMINOPHEN CONCENTRATIONS >150 ug/mL AT 4 HOURS AFTER INGESTION AND >50 ug/mL AT 12 HOURS AFTER INGESTION ARE OFTEN ASSOCIATED WITH TOXIC REACTIONS. Performed at [facility], [place] [address], [place], [place] [address]   cbc     Status: Abnormal   Collection Time: 12/23/17  6:16 AM  Result Value Ref Range   WBC 10.1 4.0 - 10.5 K/uL   RBC 4.75 3.87 - 5.11 MIL/uL   Hemoglobin 10.3 (L) 12.0 - 15.0 g/dL   HCT 31.8 (L) 36.0 - 46.0 %   MCV 66.9 (L) 78.0 - 100.0 fL   MCH 21.7 (L) 26.0 - 34.0 pg   MCHC 32.4 30.0 - 36.0 g/dL   RDW 16.8 (H) 11.5 - 15.5 %   Platelets 415 (H) 150 - 400 K/uL    Comment: Performed at [facility], [place] [address], [place], [place] [address]  I-Stat beta hCG blood, ED     Status: None   Collection Time: 12/23/17  6:21 AM  Result Value Ref Range   I-stat hCG, quantitative <5.0 <5 mIU/mL   Comment 3            Comment:   GEST. AGE      CONC.  (mIU/mL)   <=1 WEEK        5 - 50     2 WEEKS       50 - 500     3 WEEKS       100 - 10,000     4 WEEKS     1,000 - 30,000        FEMALE AND NON-PREGNANT FEMALE:     LESS THAN 5 mIU/mL     Blood Alcohol level:  Lab Results  Component Value Date   ETH <10 27/51/7001    Metabolic Disorder Labs:  No results found for: HGBA1C, MPG No results found for: PROLACTIN No results found for: CHOL, TRIG, HDL, CHOLHDL, VLDL, LDLCALC  Current Medications: Current Facility-Administered Medications  Medication Dose Route Frequency Provider Last Rate Last Dose  . acetaminophen (TYLENOL) tablet 650 mg  650 mg Oral Q4H PRN Patrecia Pour, NP      . alum & mag hydroxide-simeth (MAALOX/MYLANTA) 200-200-20 MG/5ML suspension 30 mL  30 mL Oral Q6H PRN Patrecia Pour, NP      . citalopram (CELEXA) tablet 10 mg  10 mg Oral QHS  Sharma Covert, MD      . magnesium hydroxide (MILK OF MAGNESIA) suspension 30 mL  30 mL Oral Daily PRN Patrecia Pour, NP      . ondansetron [facility]) tablet 4 mg  4 mg Oral Q8H PRN Patrecia Pour, NP      . traZODone (DESYREL) tablet 50 mg  50 mg Oral QHS PRN Patrecia Pour, NP       PTA Medications: No medications prior to admission.    Musculoskeletal: Strength & Muscle Tone: within normal limits Gait & Station: normal Patient leans: N/A  Psychiatric Specialty Exam: Physical Exam  Nursing note and vitals reviewed. Constitutional: She is oriented to person, place, and time. She appears well-developed and well-nourished.  HENT:  Head: Normocephalic and atraumatic.  Respiratory: Effort normal.  Neurological: She is alert and oriented to person, place, and time.    ROS  Blood pressure 127/80, pulse 85, temperature 98.1  F (36.7 C), temperature source Oral, resp. rate 18, height 5' 3"  (1.6 m), weight 108.9 kg (240 lb), currently breastfeeding.Body mass index is 42.51 kg/m.  General Appearance: Disheveled  Eye Contact:  Fair  Speech:  Normal Rate  Volume:  Decreased  Mood:  Depressed  Affect:  Appropriate  Thought Process:  Coherent  Orientation:  Full (Time, Place, and Person)  Thought Content:  Logical  Suicidal Thoughts:  No  Homicidal Thoughts:  No  Memory:  Immediate;   Fair  Judgement:  Intact  Insight:  Fair  Psychomotor Activity:  Increased  Concentration:  Concentration: Fair  Recall:  [employer] of Knowledge:  Fair  Language:  Fair  Akathisia:  No  Handed:  Right  AIMS (if indicated):     Assets:  Communication Skills Desire for Improvement Financial Resources/Insurance Housing Resilience Social Support  ADL's:  Intact  Cognition:  WNL  Sleep:  Number of Hours: 6.75    Treatment Plan Summary: Daily contact with patient to assess and evaluate symptoms and progress in treatment, Medication management and Plan Patient is seen and examined.   Patient is a 37 year old female with the above-stated past psychiatric history who was admitted after a suicidal gesture.  She is been started on Celexa 10 mg a day.  She found that a bit sedating, so we are going to change at bedtime.  She admitted to frequent marijuana use, and I have encouraged her to stop that.  Additionally she also recently relapsed on cocaine.  We discussed that.  She will be integrated into the milieu.  She will be encouraged to go to groups and learn coping skills.  This would be especially important with regard to coping with living with 5 children and her mother and her mother's boyfriend in the same home.  She will be placed on 15-minute checks.  Additionally she was mildly anemic in the emergency room, and does have a history of sickle cell trait.  Will order anemia labs for the a.m.  Her thyroid function studies from the emergency room were normal.  Hopefully she will improve rapidly.  Observation Level/Precautions:  15 minute checks  Laboratory:  Folic Acid  Psychotherapy:    Medications:    Consultations:    Discharge Concerns:    Estimated LOS:  Other:     Physician Treatment Plan for Primary Diagnosis: <principal problem not specified> Long Term Goal(s): Improvement in symptoms so as ready for discharge  Short Term Goals: Ability to identify changes in lifestyle to reduce recurrence of condition will improve, Ability to verbalize feelings will improve, Ability to disclose and discuss suicidal ideas, Ability to demonstrate self-control will improve, Ability to identify and develop effective coping behaviors will improve, Ability to maintain clinical measurements within normal limits will improve, Compliance with prescribed medications will improve and Ability to identify triggers associated with substance abuse/mental health issues will improve  Physician Treatment Plan for Secondary Diagnosis: Active Problems:   Major depressive disorder, single episode, severe  without psychosis ([place])  Long Term Goal(s): Improvement in symptoms so as ready for discharge  Short Term Goals: Ability to identify changes in lifestyle to reduce recurrence of condition will improve, Ability to verbalize feelings will improve, Ability to disclose and discuss suicidal ideas, Ability to demonstrate self-control will improve, Ability to identify and develop effective coping behaviors will improve, Ability to maintain clinical measurements within normal limits will improve, Compliance with prescribed medications will improve and Ability to identify triggers associated with substance  abuse/mental health issues will improve  I certify that inpatient services furnished can reasonably be expected to improve the patient's condition.    Sharma Covert, MD 4/29/20191:58 PM

## 2017-12-24 NOTE — Progress Notes (Signed)
D: Patient is pleasant with staff; she is attending groups on the unit.  She is sleeping and eating well.  She rates her depression as a 4; denies any hopelessness and rates her anxiety as a 1. Patient completed her suicide safety plan; placed on the shadow chart.  She has a good support system.  Patient states, "I'm a wonderful mother of 5.  I love me."  Patient denies any thoughts of self harm.  A: Continue to monitor medication management and MD orders.  Safety checks completed every 15 minutes per protocol.  Offer support and encouragement as needed.  R: Patient is receptive to staff; her behavior is appropriate.

## 2017-12-24 NOTE — Progress Notes (Signed)
Adult Psychoeducational Group Note  Date:  12/24/2017 Time:  8:58 PM  Group Topic/Focus:  Wrap-Up Group:   The focus of this group is to help patients review their daily goal of treatment and discuss progress on daily workbooks.  Participation Level:  Active  Participation Quality:  Appropriate  Affect:  Appropriate  Cognitive:  Alert  Insight: Appropriate  Engagement in Group:  Engaged  Modes of Intervention:  Discussion  Additional Comments:  Patient stated having a good day. Patient's goal for today was to come up with coping skills for stress.   Canary Fister L Margie Brink 12/24/2017, 8:58 PM

## 2017-12-24 NOTE — [facility] Suicide Risk Assessment (Signed)
[facility] INPATIENT:  Family/Significant Other Suicide Prevention Education  Suicide Prevention Education:  Patient Refusal for Family/Significant Other Suicide Prevention Education: The patient Megan [name] has refused to provide written consent for family/significant other to be provided Family/Significant Other Suicide Prevention Education during admission and/or prior to discharge.  Physician notified.  SPE completed with patient, as patient refused to consent to family contact. SPI pamphlet provided to pt and pt was encouraged to share information with support network, ask questions, and talk about any concerns relating to SPE. Patient denies access to guns/firearms and verbalized understanding of information provided. Mobile Crisis information also provided to patient.      Maeola Sarah 12/24/2017, 11:10 AM

## 2017-12-24 NOTE — [facility] Group Notes (Signed)
[facility] LCSW Group Therapy Note  Date/Time: 12/24/17, 1315  Type of Therapy and Topic:  Group Therapy:  Overcoming Obstacles  Participation Level:  active  Description of Group:    In this group patients will be encouraged to explore what they see as obstacles to their own wellness and recovery. They will be guided to discuss their thoughts, feelings, and behaviors related to these obstacles. The group will process together ways to cope with barriers, with attention given to specific choices patients can make. Each patient will be challenged to identify changes they are motivated to make in order to overcome their obstacles. This group will be process-oriented, with patients participating in exploration of their own experiences as well as giving and receiving support and challenge from other group members.  Therapeutic Goals: 1. Patient will identify personal and current obstacles as they relate to admission. 2. Patient will identify barriers that currently interfere with their wellness or overcoming obstacles.  3. Patient will identify feelings, thought process and behaviors related to these barriers. 4. Patient will identify two changes they are willing to make to overcome these obstacles:    Summary of Patient Progress: Pt was only present briefly before being called out by MD.  Pt identified "exploding" emotionally as her obstacle.      Therapeutic Modalities:   Cognitive Behavioral Therapy Solution Focused Therapy Motivational Interviewing Relapse Prevention Therapy  Daleen Squibb, LCSW

## 2017-12-24 NOTE — Progress Notes (Signed)
Recreation Therapy Notes  Date: 4.29.19 Time: 0930 Location: 300 [address] Dayroom  Group Topic: Stress Management  Goal Area(s) Addresses:  Patient will verbalize importance of using healthy stress management.  Patient will identify positive emotions associated with healthy stress management.   Intervention: Stress Management  Activity :  Body Scan Meditation.  LRT introduced the stress management technique of meditation to patients.  LRT played a meditation that focused on identifying any feeling of tension and stress in the body.  Patients were to follow along as the meditation played.  Education:  Stress Management, Discharge Planning.   Education Outcome: Acknowledges edcuation/In group clarification offered/Needs additional education  Clinical Observations/Feedback: Pt did not attend group.    Debany Vantol, LRT/CTRS         Turner Kunzman A 12/24/2017 12:28 PM 

## 2017-12-25 DIAGNOSIS — G4700 Insomnia, unspecified: Secondary | ICD-10-CM

## 2017-12-25 DIAGNOSIS — F1494 Cocaine use, unspecified with cocaine-induced mood disorder: Secondary | ICD-10-CM

## 2017-12-25 DIAGNOSIS — F1410 Cocaine abuse, uncomplicated: Secondary | ICD-10-CM

## 2017-12-25 DIAGNOSIS — T39392A Poisoning by other nonsteroidal anti-inflammatory drugs [NSAID], intentional self-harm, initial encounter: Secondary | ICD-10-CM

## 2017-12-25 DIAGNOSIS — F419 Anxiety disorder, unspecified: Secondary | ICD-10-CM

## 2017-12-25 DIAGNOSIS — F1290 Cannabis use, unspecified, uncomplicated: Secondary | ICD-10-CM

## 2017-12-25 LAB — VITAMIN B12: Vitamin B-12: 161 pg/mL — ABNORMAL LOW (ref 180–914)

## 2017-12-25 LAB — IRON AND TIBC
Iron: 25 ug/dL — ABNORMAL LOW (ref 28–170)
Saturation Ratios: 7 % — ABNORMAL LOW (ref 10.4–31.8)
TIBC: 358 ug/dL (ref 250–450)
UIBC: 333 ug/dL

## 2017-12-25 LAB — RETICULOCYTES
RBC.: 4.79 MIL/uL (ref 3.87–5.11)
RETIC COUNT ABSOLUTE: 47.9 10*3/uL (ref 19.0–186.0)
RETIC CT PCT: 1 % (ref 0.4–3.1)

## 2017-12-25 LAB — FERRITIN: Ferritin: 6 ng/mL — ABNORMAL LOW (ref 11–307)

## 2017-12-25 LAB — FOLATE: FOLATE: 9.4 ng/mL (ref >5.9)

## 2017-12-25 NOTE — Progress Notes (Signed)
Adult Psychoeducational Group Note  Date:  12/25/2017 Time: 1620  Group Topic/Focus:  Coping With Mental Health Crisis:  Group engaged in a game of BINGO and discussed coping skils. The purpose of this group is to help patients identify strategies for coping with mental health crisis.   Participation Level:  Active  Participation Quality:  Appropriate  Affect:  Appropriate  Cognitive:  Appropriate  Insight: Appropriate  Engagement in Group:  Engaged  Modes of Intervention:  Discussion  Additional Comments:    Ian Castagna L 12/25/2017  
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
## 2017-12-25 NOTE — Progress Notes (Signed)
D: Patient observed up and interactive on the unit. Appropriate, social with peers and staff. Patient states she is leaving early in the morning as she needs to arrive at court by 8am. States she scheduled for the morning session. Patient's affect animated, mood pleasant. Mild anxiety noted. Per self inventory and discussions with writer, rates depression at a 0/10, hopelessness at a 0/10 and anxiety at a 0/10. Rates sleep as good, appetite as good, energy as normal and concentration as good.  States goal for today is talking to people and listening and understanding. Interact more." Complained of a headache of a 5/10 however no other physical complaints.   A: Medicated per orders, prn tylenol given for discomfort. Level III obs in place for safety. Emotional support offered and self inventory reviewed. Encouraged completion of Suicide Safety Plan and programming participation. Discussed care with MD, NP and SW. Made aware of patient's request to discharge first thing to tomorrow as patient states family can come to transport her.  R: Patient verbalizes understanding of POC. On reassess, patient reports pain relief. Patient denies SI/HI/AVH and remains safe on level III obs. Will continue to monitor closely and make verbal contact frequently.

## 2017-12-25 NOTE — [facility] Group Notes (Signed)
LCSW Group Therapy Note 12/25/2017 11:43 AM  Type of Therapy/Topic: Group Therapy: Feelings about Diagnosis  Participation Level: Active   Description of Group:  This group will allow patients to explore their thoughts and feelings about diagnoses they have received. Patients will be guided to explore their level of understanding and acceptance of these diagnoses. Facilitator will encourage patients to process their thoughts and feelings about the reactions of others to their diagnosis and will guide patients in identifying ways to discuss their diagnosis with significant others in their lives. This group will be process-oriented, with patients participating in exploration of their own experiences, giving and receiving support, and processing challenge from other group members.  Therapeutic Goals: 1. Patient will demonstrate understanding of diagnosis as evidenced by identifying two or more symptoms of the disorder 2. Patient will be able to express two feelings regarding the diagnosis 3. Patient will demonstrate their ability to communicate their needs through discussion and/or role play  Summary of Patient Progress:  Jaretzi was engaged and participated throughout the group session. Alysen reports that she was "fearful" when she learned about her mental health diagnosis. Kripa reports that prior to coming to the hospital, she did not have any support from her family and loved ones. She states that she hopes her breakdown and hospitalization was an eye opener for her family, in which she hopes they become more involved in helping care for her five children.    Therapeutic Modalities:  Cognitive Behavioral Therapy Brief Therapy Feelings Identification    Chazlyn Cude Catalina [place] Clinical Social Worker

## 2017-12-25 NOTE — Progress Notes (Signed)
Recreation Therapy Notes  Animal-Assisted Activity (AAA) Program Checklist/Progress Notes Patient Eligibility Criteria Checklist & Daily Group note for Rec Tx Intervention  Date: 4.30.19 Time: 0245 Location: [address] Dayroom  AAA/T Program Assumption of Risk Form signed by Patient/ or Parent Legal Guardian YES   Patient is free of allergies or sever asthma YES   Patient reports no fear of animals YES   Patient reports no history of cruelty to animals YES   Patient understands his/her participation is voluntary YES   Patient washes hands before animal contact YES   Patient washes hands after animal contact YES   Behavioral Response: Engaged  Education: Hand Washing, Appropriate Animal Interaction   Education Outcome: Acknowledges understanding/In group clarification offered/Needs additional education.   Clinical Observations/Feedback: Pt attended and participated in activity.    Morgon Pamer, LRT/CTRS         Anwen Cannedy A 12/25/2017 2:56 PM 

## 2017-12-25 NOTE — Plan of Care (Signed)
Patient verbalizes understanding of information, education provided. 

## 2017-12-25 NOTE — Progress Notes (Signed)
Adult Psychoeducational Group Note  Date:  12/25/2017 Time:  9:33 PM  Group Topic/Focus:  Wrap-Up Group:   The focus of this group is to help patients review their daily goal of treatment and discuss progress on daily workbooks.  Participation Level:  Active  Participation Quality:  Appropriate  Affect:  Appropriate  Cognitive:  Appropriate  Insight: Appropriate  Engagement in Group:  Engaged  Modes of Intervention:  Discussion  Additional Comments:  Patient attended group and said that her day was a 9.  Her coping skills were socializing, and talking with her kids via phone.   Voshon Petro W Aretta Stetzel 12/25/2017, 9:33 PM

## 2017-12-26 DIAGNOSIS — F1410 Cocaine abuse, uncomplicated: Secondary | ICD-10-CM

## 2017-12-26 DIAGNOSIS — F1494 Cocaine use, unspecified with cocaine-induced mood disorder: Secondary | ICD-10-CM

## 2017-12-26 MED ORDER — CITALOPRAM HYDROBROMIDE 10 MG PO TABS: 10.0000 mg | ORAL_TABLET | Freq: Every day | ORAL | 0 refills | Status: DC

## 2017-12-26 MED ORDER — TRAZODONE HCL 50 MG PO TABS: 50.0000 mg | ORAL_TABLET | Freq: Every evening | ORAL | 0 refills | Status: DC | PRN

## 2017-12-26 MED ORDER — TAB-A-VITE/IRON PO TABS
1.0000 | ORAL_TABLET | Freq: Every day | ORAL | Status: DC
  Filled 2017-12-26 (×2): qty 1

## 2017-12-26 NOTE — Progress Notes (Signed)
  [facility] Adult Case Management Discharge Plan :  Will you be returning to the same living situation after discharge:  Yes,  with significant other At discharge, do you have transportation home?: Yes,  mom Do you have the ability to pay for your medications: No.Pt will use [facility].  Release of information consent forms completed and in the chart;  Patient's signature needed at discharge.  Patient to Follow up at: Follow-up Information    [facility] Follow up.   Specialty:  Behavioral Health Why:  Appointment is 12/28/17 at 9:00am. Please be sure to bring your Photo ID, SSN, any insurance information and any discharge paperwork from the hospital. Contact information: [address] [place] [address] [phone]        [facility] Follow up on 12/31/2017.   Specialty:  Behavioral Health Why:  Appointment for therapy is 12/31/17 at 2:00pm with Sloan Leiter.   Contact information: [address] [place] [address] [phone]           Next level of care provider has access to [facility] Link:no  Safety Planning and Suicide Prevention discussed: No. Pt declined.  Have you used any form of tobacco in the last 30 days? (Cigarettes, Smokeless Tobacco, Cigars, and/or Pipes): Yes  Has patient been referred to the Quitline?: Patient refused referral  Patient has been referred for addiction treatment: Yes  Lorri Frederick, LCSW 12/26/2017, 10:24 AM

## 2017-12-26 NOTE — Discharge Summary (Addendum)
Physician Discharge Summary Note  Patient:  Megan [name] is an 37 y.o., female MRN:  [id number] DOB:  06-16-1981 Patient phone:  [phone] (home)  Patient address:   [address] [place] [address],  Total Time spent with patient: 20 minutes  Date of Admission:  12/23/2017 Date of Discharge: 12/26/17  Reason for Admission:  Worsening anxiety and depression with intentional overdose attempt  Principal Problem: Major depressive disorder, single episode, severe without psychosis [facility]) Discharge Diagnoses: Patient Active Problem List   Diagnosis Date Noted  . Moderate cocaine use disorder (HCC) [F14.20]   . 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 Cesarean delivery delivered [O82] 08/24/2013  . Maternal anemia complicating pregnancy, childbirth, or the puerperium [IMO0002] 08/24/2013  . Asymptomatic bacteriuria in pregnancy [O99.89, R82.71] 08/24/2013  . Normal delivery at term [O80] 08/23/2013  . Fetal heart rate decelerations affecting management of mother [O13.0865] 08/23/2013  . Gestational hypertension [O13.9] 08/19/2013  . Obesity complicating pregnancy in third trimester [O99.213] 08/19/2013  . Grand multipara [Z64.1] 08/19/2013    Past Psychiatric History: Patient has one previous psychiatric admission as an adolescent at the behavioral health hospital.  No current psychiatric treatment, no current psychiatric medication.  Past Medical History:  Past Medical History:  Diagnosis Date  . Anemia   . Obese   . Sickle cell trait (HCC)   . Thyroid disease     Past Surgical History:  Procedure Laterality Date  . CESAREAN SECTION WITH BILATERAL TUBAL LIGATION N/A 08/23/2013   Procedure: Primary Cesarean Section Delivery Baby Girl @ 2347, Apgars 8/9, Bilateral Tubal Ligation;  Surgeon: Antionette Char, MD;  Location: [facility];  Service: Obstetrics;  Laterality: N/A;  . FINGER NAIL SURGERY    . KNEE  SURGERY    . WISDOM TOOTH EXTRACTION     Family History:  Family History  Problem Relation Age of Onset  . Hypertension Mother   . Diabetes Mother   . Heart disease Mother   . Sickle cell trait Mother    Family Psychiatric  History: Mother is an alcoholic, and also sounds like she is depressed.  Social History:  Social History   Substance and Sexual Activity  Alcohol Use No     Social History   Substance and Sexual Activity  Drug Use Yes  . Frequency: 7.0 times per week  . Types: Marijuana   Comment: 1 gram per day    Social History   Socioeconomic History  . Marital status: Single    Spouse name: Not on file  . Number of children: Not on file  . Years of education: Not on file  . Highest education level: Not on file  Occupational History  . Not on file  Social Needs  . Financial resource strain: Not on file  . Food insecurity:    Worry: Not on file    Inability: Not on file  . Transportation needs:    Medical: Not on file    Non-medical: Not on file  Tobacco Use  . Smoking status: Current Some Day Smoker    Types: Cigarettes    Last attempt to quit: 01/01/2013    Years since quitting: 4.9  . Smokeless tobacco: Never Used  Substance and Sexual Activity  . Alcohol use: No  . Drug use: Yes    Frequency: 7.0 times per week    Types: Marijuana    Comment: 1 gram per day  . Sexual activity: Yes  Birth control/protection: None  Lifestyle  . Physical activity:    Days per week: Not on file    Minutes per session: Not on file  . Stress: Not on file  Relationships  . Social connections:    Talks on phone: Not on file    Gets together: Not on file    Attends religious service: Not on file    Active member of club or organization: Not on file    Attends meetings of clubs or organizations: Not on file    Relationship status: Not on file  Other Topics Concern  . Not on file  Social History Narrative  . Not on file    Hospital Course:   12/24/17 [facility] MD  Assessment: Patient is seen and examined.  Patient is a 37 year old female with a probable past psychiatric history significant for major depression as well as generalized anxiety disorder.  She presented to the [facility] emergency department after an intentional overdose of Advil.  She had gotten into an argument with her boyfriend.  These arguments of been going on for the last couple of weeks.  She had additional stressors of taking care of her 5 children, and her mother and her mother's boyfriend live in the same home.  She also has a court date on Wednesday for custody issues of 1 of her children.  She got upset by the argument, and picked up a baseball bat.  Likely she did not harm anyone with that.  She went out for a walk for a couple hours and realized that what was going on was not a good thing.  She admitted to frequent marijuana use, and had been clean of cocaine for several years but used a couple hours prior to her an argument with her boyfriend.  She was seen in the emergency room and felt to be dangerous to self.  She was transferred to our facility for evaluation and stabilization.  She is not on any current medication for psychiatric reasons, not seeing a psychiatrist.  She had one previous hospitalization here as an adolescent  Patient remained on the [facility] unit for 2 days. The patient stabilized on medication and therapy. Patient was discharged on Celexa 10 mg Daily and Trazodone 50 mg QHS PRN. Patient has shown improvement with improved mood, affect, sleep, appetite, and interaction. Patient has attended group and participated. Patient has been seen in the day room interacting with peers and staff appropriately. Patient denies any SI/HI/AVH and contracts for safety. Patient agrees to follow up at [facility] and [facility]. Patient is provided with prescriptions for their medications upon discharge.   Physical Findings: AIMS: Facial and Oral Movements Muscles of Facial  Expression: None, normal Lips and Perioral Area: None, normal Jaw: None, normal Tongue: None, normal,Extremity Movements Upper (arms, wrists, hands, fingers): None, normal Lower (legs, knees, ankles, toes): None, normal, Trunk Movements Neck, shoulders, hips: None, normal, Overall Severity Severity of abnormal movements (highest score from questions above): None, normal Incapacitation due to abnormal movements: None, normal Patient's awareness of abnormal movements (rate only patient's report): No Awareness, Dental Status Current problems with teeth and/or dentures?: No Does patient usually wear dentures?: No  CIWA:    COWS:     Musculoskeletal: Strength & Muscle Tone: within normal limits Gait & Station: normal Patient leans: N/A  Psychiatric Specialty Exam: Physical Exam  Nursing note and vitals reviewed. Constitutional: She is oriented to person, place, and time. She appears well-developed and well-nourished.  Cardiovascular:  Normal rate.  Respiratory: Effort normal.  Musculoskeletal: Normal range of motion.  Neurological: She is alert and oriented to person, place, and time.  Skin: Skin is warm.    Review of Systems  Constitutional: Negative.   HENT: Negative.   Eyes: Negative.   Respiratory: Negative.   Cardiovascular: Negative.   Gastrointestinal: Negative.   Genitourinary: Negative.   Musculoskeletal: Negative.   Skin: Negative.   Neurological: Negative.   Endo/Heme/Allergies: Negative.   Psychiatric/Behavioral: Negative.     Blood pressure 127/82, pulse 87, temperature 98.2 F (36.8 C), temperature source Oral, resp. rate 18, height  (1.6 m), weight 108.9 kg (240 lb), currently breastfeeding.Body mass index is 42.51 kg/m.  General Appearance: Casual  Eye Contact:  Good  Speech:  Clear and Coherent and Normal Rate  Volume:  Normal  Mood:  Euthymic  Affect:  Congruent  Thought Process:  Goal Directed and Descriptions of Associations: Intact   Orientation:  Full (Time, Place, and Person)  Thought Content:  WDL  Suicidal Thoughts:  No  Homicidal Thoughts:  No  Memory:  Immediate;   Good Recent;   Good Remote;   Good  Judgement:  Fair  Insight:  Fair  Psychomotor Activity:  Normal  Concentration:  Concentration: Good and Attention Span: Good  Recall:  Good  Fund of Knowledge:  Good  Language:  Good  Akathisia:  No  Handed:  Right  AIMS (if indicated):     Assets:  Communication Skills Desire for Improvement Financial Resources/Insurance Housing Physical Health Social Support Transportation  ADL's:  Intact  Cognition:  WNL  Sleep:  Number of Hours: 6     Have you used any form of tobacco in the last 30 days? (Cigarettes, Smokeless Tobacco, Cigars, and/or Pipes): Yes  Has this patient used any form of tobacco in the last 30 days? (Cigarettes, Smokeless Tobacco, Cigars, and/or Pipes) Yes, Yes, A prescription for an FDA-approved tobacco cessation medication was offered at discharge and the patient refused  Blood Alcohol level:  Lab Results  Component Value Date   ETH <10 12/23/2017    Metabolic Disorder Labs:  No results found for: HGBA1C, MPG No results found for: PROLACTIN No results found for: CHOL, TRIG, HDL, CHOLHDL, VLDL, LDLCALC  See Psychiatric Specialty Exam and Suicide Risk Assessment completed by Attending Physician prior to discharge.  Discharge destination:  Home  Is patient on multiple antipsychotic therapies at discharge:  No   Has Patient had three or more failed trials of antipsychotic monotherapy by history:  No  Recommended Plan for Multiple Antipsychotic Therapies: NA   Allergies as of 12/26/2017   No Known Allergies     Medication List    TAKE these medications     Indication  citalopram 10 MG tablet Commonly known as:  CELEXA Take 1 tablet (10 mg total) by mouth at bedtime. For mood control  Indication:  mood stability   traZODone 50 MG tablet Commonly known as:   DESYREL Take 1 tablet (50 mg total) by mouth at bedtime as needed for sleep.  Indication:  Trouble Sleeping      Follow-up Information    [facility] Follow up.   Specialty:  Behavioral Health Why:  Appointment is 12/28/17 at 9:00am. Please be sure to bring your Photo ID, SSN, any insurance information and any discharge paperwork from the hospital. Contact information: [address] [place] [address] [phone]        [facility] Follow up  on 12/31/2017.   Specialty:  Behavioral Health Why:  Appointment for therapy is 12/31/17 at 2:00pm with Sloan Leiter.   Contact information: [address] [place] [address] [phone]           Follow-up recommendations:  Continue activity as tolerated. Continue diet as recommended by your PCP. Ensure to keep all appointments with outpatient providers.  Comments:  Patient is instructed prior to discharge to: Take all medications as prescribed by his/her mental healthcare provider. Report any adverse effects and or reactions from the medicines to his/her outpatient provider promptly. Patient has been instructed & cautioned: To not engage in alcohol and or illegal drug use while on prescription medicines. In the event of worsening symptoms, patient is instructed to call the crisis hotline, 911 and or go to the nearest ED for appropriate evaluation and treatment of symptoms. To follow-up with his/her primary care provider for your other medical issues, concerns and or health care needs.    Signed: Gerlene Burdock Money, FNP 12/26/2017, 8:41 AM   Patient seen, Suicide Assessment Completed.  Disposition Plan Reviewed

## 2017-12-26 NOTE — Progress Notes (Signed)
Pt reports her day was great.  She denies SI/HI/AVH.  She hopes to be discharged some time tomorrow.  She has been observed talking on the phone multiple times throughout the evening.  She has also been in the dayroom working on a puzzle with her peers.  Pt makes her needs known to staff.  Support and encouragement offered.  Discharge plans are in process.  Safety maintained with q15 minute checks.

## 2017-12-26 NOTE — BHH Suicide Risk Assessment (Signed)
[facility] Discharge Suicide Risk Assessment   Principal Problem: Major depressive disorder, single episode, severe without psychosis (HCC) Discharge Diagnoses:  Patient Active Problem List   Diagnosis Date Noted  . Moderate cocaine use disorder (HCC) [F14.20]   . 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 Cesarean delivery delivered [O82] 08/24/2013  . Maternal anemia complicating pregnancy, childbirth, or the puerperium [IMO0002] 08/24/2013  . Asymptomatic bacteriuria in pregnancy [O99.89, R82.71] 08/24/2013  . Normal delivery at term [O80] 08/23/2013  . Fetal heart rate decelerations affecting management of mother [W09.8119] 08/23/2013  . Gestational hypertension [O13.9] 08/19/2013  . Obesity complicating pregnancy in third trimester [O99.213] 08/19/2013  . Grand multipara [Z64.1] 08/19/2013    Total Time spent with patient: 30 minutes  Musculoskeletal: Strength & Muscle Tone: within normal limits Gait & Station: normal Patient leans: N/A  Psychiatric Specialty Exam: ROS no headache, no chest pain, no shortness of breath, no nausea, no vomiting  Blood pressure 127/82, pulse 87, temperature 98.2 F (36.8 C), temperature source Oral, resp. rate 18, height  (1.6 m), weight 108.9 kg (240 lb), currently breastfeeding.Body mass index is 42.51 kg/m.  General Appearance: Well Groomed  Eye Contact::  Good  Speech:  Normal Rate409  Volume:  Normal  Mood:  Mood improved today and presents euthymic at this time  Affect:  Appropriate and Full Range  Thought Process:  Linear and Descriptions of Associations: Intact  Orientation:  Other:  Fully alert and attentive  Thought Content:  No hallucinations, no delusions, not internally preoccupied  Suicidal Thoughts:  No denies any suicidal or self-injurious ideations, denies any homicidal ideations  Homicidal Thoughts:  No  Memory:  Recent and remote grossly intact  Judgement:  Other:   Improving  Insight:  Improving  Psychomotor Activity:  Normal  Concentration:  Good  Recall:  Good  Fund of Knowledge:Good  Language: Good  Akathisia:  Negative  Handed:  Right  AIMS (if indicated):     Assets:  Communication Skills Desire for Improvement Resilience  Sleep:  Number of Hours: 6  Cognition: WNL  ADL's:  Intact   Mental Status Per Nursing Assessment::   On Admission:     Demographic Factors:  37 year old female, 5 children, lives with mother  Loss Factors: Custody issues, reports relationship with mother is sometimes tense  Historical Factors: History of depression, one prior psychiatric admission as a teenager, was not receiving psychiatric care prior to admission  Risk Reduction Factors:   Responsible for children under 70 years of age, Sense of responsibility to family, Living with another person, especially a relative and Positive coping skills or problem solving skills  Continued Clinical Symptoms:  At this time presents alert,  attentive, well related, states she is feeling "much better" than upon admission and at present presents euthymic, with a full range of affect, no thought disorder, not suicidal, not homicidal, no hallucinations, no delusions, future oriented.. Denies medication side effects.  Feels medications are helping. Behavior on unit in good control, pleasant on approach. Labs reviewed.  Patient reports a history of anemia and of sickle cell traits.  Plans to take multivitamin and improve diet, encouraged to establish and follow-up with a PCP.   Cognitive Features That Contribute To Risk:  No gross cognitive deficits noted upon discharge. Is alert , attentive, and oriented x 3    Suicide Risk:  Mild:  Suicidal ideation of limited frequency, intensity, duration, and specificity.  There are no identifiable plans, no  associated intent, mild dysphoria and related symptoms, good self-control (both objective and subjective assessment), few other  risk factors, and identifiable protective factors, including available and accessible social support.  Follow-up Information    [facility] Follow up.   Specialty:  Behavioral Health Why:  Appointment is 12/28/17 at 9:00am. Please be sure to bring your Photo ID, SSN, any insurance information and any discharge paperwork from the hospital. Contact information: [address] [place] [address] [phone]        [facility] Follow up on 12/31/2017.   Specialty:  Behavioral Health Why:  Appointment for therapy is 12/31/17 at 2:00pm with Sloan Leiter.   Contact information: [address] [place] [address] [phone]        [facility] Follow up.   Why:  Please call to make an appointment. Contact information: [address]  [place], [place] [address] P: [phone]          Plan Of Care/Follow-up recommendations:  Activity:  as tolerated  Diet:  regular Tests:  NA Other:  See below  Patient expresses significant improvement compared to admission and readiness for discharge, leaving unit in good spirits. Plans to return home. Plans to follow-up as above.  Craige Cotta, MD 12/26/2017, 10:34 AM

## 2017-12-26 NOTE — Progress Notes (Signed)
D: Patient observed up and visible in the dayroom. Interacting with peers and staff appropriately. Patient states "I'm going home today. I feel positive about it, ready." Patient's affect appropriate, animated, mood pleasant. Per self inventory and discussions with writer, rates depression at a 0/10, hopelessness at a 0/10 and anxiety at a 0/10. Rates sleep as fair, appetite as good, energy as normal and concentration as good.  States goal for today is to "discharge, anything I have to.' Denies pain, physical complaints.   A: No medications ordered this AM and denies need for prn. Level III obs in place for safety. Emotional support offered and self inventory reviewed. Encouraged completion of Suicide Safety Plan and programming participation. Will discuss POC with MD, SW.    R: Patient verbalizes understanding of POC. Patient denies SI/HI/AVH and remains safe on level III obs. Will continue to monitor closely and make verbal contact frequently. Awaiting progression meeting to confirm POC/discharge.

## 2017-12-26 NOTE — Progress Notes (Signed)
Recreation Therapy Notes  Date: 5.1.19 Time: 0930 Location: [address] Dayroom  Group Topic: Stress Management  Goal Area(s) Addresses:  Patient will verbalize importance of using healthy stress management.  Patient will identify positive emotions associated with healthy stress management.   Behavioral Response: Engaged  Intervention: Stress Management  Activity :  Progressive Muscle Relaxation.  LRT introduced the stress management technique of PMR.  LRT led group in tensing and then relaxing each muscle.  Patients followed along to engage in activity.  Education:  Stress Management, Discharge Planning.   Education Outcome: Acknowledges edcuation/In group clarification offered/Needs additional education  Clinical Observations/Feedback: Pt attended and participated in group.    Caroll Rancher, LRT/CTRS         Caroll Rancher A 12/26/2017 11:17 AM

## 2017-12-26 NOTE — Progress Notes (Signed)
Patient verbalizes readiness for discharge. Follow up plan explained, AVS, transition record and SRA given along with prescriptions. Sample meds provided. All belongings returned. Suicide Safety Plan completed, on chart and copy given to patient. Patient verbalizes understanding. Denies SI/HI and assures this Clinical research associate she will seek assistance should that change. Patient discharged ambulatory and in stable condition to her boyfriend.

## 2018-04-28 ENCOUNTER — Encounter (HOSPITAL_COMMUNITY): Payer: Self-pay | Admitting: Emergency Medicine

## 2018-04-28 ENCOUNTER — Other Ambulatory Visit: Payer: Self-pay

## 2018-04-28 ENCOUNTER — Emergency Department (HOSPITAL_COMMUNITY)
Admission: EM | Admit: 2018-04-28 | Discharge: 2018-04-28 | Disposition: A | Discharge status: Home / Self Care | Payer: Self-pay | Attending: Emergency Medicine | Admitting: Emergency Medicine

## 2018-04-28 ENCOUNTER — Emergency Department (HOSPITAL_COMMUNITY): Payer: Self-pay

## 2018-04-28 ENCOUNTER — Ambulatory Visit (HOSPITAL_COMMUNITY)
Admission: EM | Admit: 2018-04-28 | Discharge: 2018-04-28 | Disposition: A | Discharge status: Home / Self Care | Payer: Self-pay | Attending: Family Medicine | Admitting: Family Medicine

## 2018-04-28 DIAGNOSIS — J020 Streptococcal pharyngitis: Secondary | ICD-10-CM

## 2018-04-28 DIAGNOSIS — R499 Unspecified voice and resonance disorder: Secondary | ICD-10-CM

## 2018-04-28 DIAGNOSIS — J039 Acute tonsillitis, unspecified: Secondary | ICD-10-CM

## 2018-04-28 DIAGNOSIS — F17210 Nicotine dependence, cigarettes, uncomplicated: Secondary | ICD-10-CM | POA: Insufficient documentation

## 2018-04-28 DIAGNOSIS — J36 Peritonsillar abscess: Secondary | ICD-10-CM | POA: Insufficient documentation

## 2018-04-28 DIAGNOSIS — R498 Other voice and resonance disorders: Secondary | ICD-10-CM

## 2018-04-28 LAB — CBC WITH DIFFERENTIAL/PLATELET
BASOS ABS: 0 10*3/uL (ref 0.0–0.1)
Basophils Relative: 0 %
Eosinophils Absolute: 0 10*3/uL (ref 0.0–0.7)
Eosinophils Relative: 0 %
HEMATOCRIT: 34.7 % — AB (ref 36.0–46.0)
Hemoglobin: 10.9 g/dL — ABNORMAL LOW (ref 12.0–15.0)
LYMPHS ABS: 1.8 10*3/uL (ref 0.7–4.0)
Lymphocytes Relative: 8 %
MCH: 21.8 pg — ABNORMAL LOW (ref 26.0–34.0)
MCHC: 31.4 g/dL (ref 30.0–36.0)
MCV: 69.3 fL — ABNORMAL LOW (ref 78.0–100.0)
MONO ABS: 1.1 10*3/uL — AB (ref 0.1–1.0)
MONOS PCT: 5 %
Neutro Abs: 19.1 10*3/uL — ABNORMAL HIGH (ref 1.7–7.7)
Neutrophils Relative %: 87 %
Platelets: 462 10*3/uL — ABNORMAL HIGH (ref 150–400)
RBC: 5.01 MIL/uL (ref 3.87–5.11)
RDW: 17.2 % — AB (ref 11.5–15.5)
WBC: 22 10*3/uL — AB (ref 4.0–10.5)

## 2018-04-28 LAB — BASIC METABOLIC PANEL
Anion gap: 11 (ref 5–15)
BUN: 5 mg/dL — ABNORMAL LOW (ref 6–20)
CALCIUM: 9.1 mg/dL (ref 8.9–10.3)
CO2: 24 mmol/L (ref 22–32)
CREATININE: 0.8 mg/dL (ref 0.44–1.00)
Chloride: 107 mmol/L (ref 98–111)
GFR calc non Af Amer: >60 mL/min (ref >60)
GLUCOSE: 113 mg/dL — AB (ref 70–99)
Potassium: 3.5 mmol/L (ref 3.5–5.1)
Sodium: 142 mmol/L (ref 135–145)

## 2018-04-28 LAB — POC URINE PREG, ED: PREG TEST UR: NEGATIVE

## 2018-04-28 LAB — POCT RAPID STREP A: Streptococcus, Group A Screen (Direct): POSITIVE — AB

## 2018-04-28 MED ORDER — BENZOCAINE 20 % MT AERO
INHALATION_SPRAY | Freq: Once | OROMUCOSAL | Status: AC
  Administered 2018-04-28: 18:00:00 via OROMUCOSAL
  Filled 2018-04-28: qty 57

## 2018-04-28 MED ORDER — KETOROLAC TROMETHAMINE 30 MG/ML IJ SOLN
30.0000 mg | Freq: Once | INTRAMUSCULAR | Status: AC
  Administered 2018-04-28: 30 mg via INTRAVENOUS
  Filled 2018-04-28: qty 1

## 2018-04-28 MED ORDER — DEXAMETHASONE SODIUM PHOSPHATE 10 MG/ML IJ SOLN
10.0000 mg | Freq: Once | INTRAMUSCULAR | Status: AC
  Administered 2018-04-28: 10 mg via INTRAVENOUS
  Filled 2018-04-28: qty 1

## 2018-04-28 MED ORDER — CLINDAMYCIN HCL 300 MG PO CAPS: 300.0000 mg | ORAL_CAPSULE | Freq: Four times a day (QID) | ORAL | 0 refills | Status: DC

## 2018-04-28 MED ORDER — CLINDAMYCIN PHOSPHATE 600 MG/50ML IV SOLN
600.0000 mg | Freq: Once | INTRAVENOUS | Status: AC
  Administered 2018-04-28: 600 mg via INTRAVENOUS
  Filled 2018-04-28: qty 50

## 2018-04-28 MED ORDER — DEXAMETHASONE 6 MG PO TABS: 10.0000 mg | ORAL_TABLET | Freq: Every day | ORAL | 0 refills | Status: DC

## 2018-04-28 MED ORDER — IOHEXOL 300 MG/ML  SOLN
75.0000 mL | Freq: Once | INTRAMUSCULAR | Status: AC | PRN
  Administered 2018-04-28: 75 mL via INTRAVENOUS

## 2018-04-28 MED ORDER — LIDOCAINE VISCOUS HCL 2 % MT SOLN
15.0000 mL | Freq: Once | OROMUCOSAL | Status: AC
  Administered 2018-04-28: 15 mL via OROMUCOSAL
  Filled 2018-04-28: qty 15

## 2018-04-28 NOTE — Discharge Instructions (Addendum)
Concern for retropharyngeal abscess vs. Peritonsillar abscess  Recommending further evaluation and management in ED Patient aware and in agreement with this plan

## 2018-04-28 NOTE — ED Provider Notes (Signed)
Emergency Department Provider Note   I have reviewed the triage vital signs and the nursing notes.   HISTORY  Chief Complaint Sore Throat   HPI Megan [name] is a 37 y.o. female with multiple medical problems documented below the presents to the emergency department today secondary to sore throat.  She was seen in urgent care where they are worried about retropharyngeal abscess versus peritonsillar abscess.  She states that she has significant pain with swallowing and she feels like her voice is more hoarse than normal.  She was strep positive there.  Did not start antibiotics or give any other medications.  Patient feels like the right side is more painful and swollen than the left side.  No other abnormalities. No other associated or modifying symptoms.    Past Medical History:  Diagnosis Date  . Anemia   . Obese   . Sickle cell trait (HCC)   . Thyroid disease     Patient Active Problem List   Diagnosis Date Noted  . Cocaine use disorder, mild, abuse (HCC)   . Cocaine-induced mood disorder (HCC)   . Moderate cocaine use disorder (HCC)   . Major depressive disorder, single episode, severe without psychosis (HCC) 12/23/2017  . Cesarean delivery delivered 08/24/2013  . Maternal anemia complicating pregnancy, childbirth, or the puerperium 08/24/2013  . Asymptomatic bacteriuria in pregnancy 08/24/2013  . Normal delivery at term 08/23/2013  . Fetal heart rate decelerations affecting management of mother 08/23/2013  . Gestational hypertension 08/19/2013  . Obesity complicating pregnancy in third trimester 08/19/2013  . Grand multipara 08/19/2013    Past Surgical History:  Procedure Laterality Date  . CESAREAN SECTION WITH BILATERAL TUBAL LIGATION N/A 08/23/2013   Procedure: Primary Cesarean Section Delivery Baby Girl @ 2347, Apgars 8/9, Bilateral Tubal Ligation;  Surgeon: Antionette Char, MD;  Location: [facility];  Service: Obstetrics;  Laterality: N/A;  . FINGER  NAIL SURGERY    . KNEE SURGERY    . WISDOM TOOTH EXTRACTION      Current Outpatient Rx  . Order #: [id number] Class: Historical Med  . Order #: [id number] Class: Historical Med  . Order #: [id number] Class: Print  . Order #: [id number] Class: Print    Allergies Patient has no known allergies.  Family History  Problem Relation Age of Onset  . Hypertension Mother   . Diabetes Mother   . Heart disease Mother   . Sickle cell trait Mother     Social History Social History   Tobacco Use  . Smoking status: Current Some Day Smoker    Types: Cigarettes    Last attempt to quit: 01/01/2013    Years since quitting: 5.3  . Smokeless tobacco: Never Used  Substance Use Topics  . Alcohol use: No  . Drug use: Yes    Frequency: 7.0 times per week    Types: Marijuana    Comment: 1 gram per day    Review of Systems  All other systems negative except as documented in the HPI. All pertinent positives and negatives as reviewed in the HPI. ____________________________________________   PHYSICAL EXAM:  VITAL SIGNS: ED Triage Vitals  Enc Vitals Group     BP 04/28/18 1321 (!) 144/94     Pulse Rate 04/28/18 1321 (!) 110     Resp 04/28/18 1321 20     Temp 04/28/18 1321 99.4 F (37.4 C)     Temp Source 04/28/18 1321 Oral     SpO2 04/28/18 1321 98 %     Weight  04/28/18 1320 238 lb 1.6 oz (108 kg)     Height 04/28/18 1320 5\' 3"  (1.6 m)    Constitutional: Alert and oriented. Well appearing and in no acute distress. Eyes: Conjunctivae are normal. PERRL. EOMI. Head: Atraumatic. Nose: No congestion/rhinnorhea. Mouth/Throat: Mucous membranes are moist.  Oropharynx non-erythematous. Significant tonsillar edema.  Neck: No stridor.  No meningeal signs.   Cardiovascular: Normal rate, regular rhythm. Good peripheral circulation. Grossly normal heart sounds.   Respiratory: Normal respiratory effort.  No retractions. Lungs CTAB. Gastrointestinal: Soft and nontender. No distention.  Musculoskeletal:  No lower extremity tenderness nor edema. No gross deformities of extremities. Neurologic:  Normal speech and language. No gross focal neurologic deficits are appreciated.  Skin:  Skin is warm, dry and intact. No rash noted.  ____________________________________________   LABS (all labs ordered are listed, but only abnormal results are displayed)  Labs Reviewed  CBC WITH DIFFERENTIAL/PLATELET - Abnormal; Notable for the following components:      Result Value   WBC 22.0 (*)    Hemoglobin 10.9 (*)    HCT 34.7 (*)    MCV 69.3 (*)    MCH 21.8 (*)    RDW 17.2 (*)    Platelets 462 (*)    Neutro Abs 19.1 (*)    Monocytes Absolute 1.1 (*)    All other components within normal limits  BASIC METABOLIC PANEL - Abnormal; Notable for the following components:   Glucose, Bld 113 (*)    BUN 5 (*)    All other components within normal limits  POC URINE PREG, ED   ____________________________________________   RADIOLOGY  No results found.  ____________________________________________   PROCEDURES  Procedure(s) performed:   Marland KitchenMarland KitchenIncision and Drainage Date/Time: 05/01/2018 7:13 AM Performed by: Marily Memos, MD Authorized by: Marily Memos, MD   Consent:    Consent obtained:  Verbal   Consent given by:  Patient   Risks discussed:  Bleeding, incomplete drainage and infection   Alternatives discussed:  No treatment and delayed treatment Location:    Type:  Abscess   Size:  1x2   Location:  Mouth   Mouth location:  Peritonsillar Anesthesia (see MAR for exact dosages):    Anesthesia method:  Topical application   Topical anesthetic:  Benzocaine gel Procedure type:    Complexity:  Simple Procedure details:    Needle aspiration: yes     Needle size:  18 G   Incision types:  Stab incision   Drainage:  Bloody   Drainage amount:  Scant Post-procedure details:    Patient tolerance of procedure:  Tolerated well, no immediate complications Comments:     Unsuccessful needle  aspiration     ____________________________________________   INITIAL IMPRESSION / ASSESSMENT AND PLAN / ED COURSE  PTA on CT scan.   Attempted needle aspiration as above but unsuccessful. D/w Dr. Pollyann Kennedy who will see in office in follow up.   Pertinent labs & imaging results that were available during my care of the patient were reviewed by me and considered in my medical decision making (see chart for details).  ____________________________________________  FINAL CLINICAL IMPRESSION(S) / ED DIAGNOSES  Final diagnoses:  Peritonsillar abscess     MEDICATIONS GIVEN DURING THIS VISIT:  Medications  lidocaine (XYLOCAINE) 2 % viscous mouth solution 15 mL (15 mLs Mouth/Throat Given 04/28/18 1438)  ketorolac (TORADOL) 30 MG/ML injection 30 mg (30 mg Intravenous Given 04/28/18 1523)  dexamethasone (DECADRON) injection 10 mg (10 mg Intravenous Given 04/28/18 1524)  clindamycin (CLEOCIN) IVPB  600 mg (0 mg Intravenous Stopped 04/28/18 1646)  iohexol (OMNIPAQUE) 300 MG/ML solution 75 mL (75 mLs Intravenous Contrast Given 04/28/18 1702)  Benzocaine (HURRCAINE) 20 % mouth spray ( Mouth/Throat Given by Other 04/28/18 1807)     NEW OUTPATIENT MEDICATIONS STARTED DURING THIS VISIT:  Discharge Medication List as of 04/28/2018  6:54 PM    START taking these medications   Details  clindamycin (CLEOCIN) 300 MG capsule Take 1 capsule (300 mg total) by mouth 4 (four) times daily. X 7 days, Starting Sun 04/28/2018, Print    dexamethasone (DECADRON) 6 MG tablet Take 1.5 tablets (9 mg total) by mouth daily., Starting Sun 04/28/2018, Normal        Note:  This note was prepared with assistance of Dragon voice recognition software. Occasional wrong-word or sound-a-like substitutions may have occurred due to the inherent limitations of voice recognition software.   Marily Memos, MD 05/01/18 [phone]

## 2018-04-28 NOTE — ED Triage Notes (Signed)
Sent by [facility] for further eval/management of sore throat. Pt has had sore throat X4 days. Red, enlarged, white coated tonsils. Concern for abscess, sent here for CT.

## 2018-04-28 NOTE — ED Notes (Signed)
Signature pad unavailable at time of pt discharge. Pt verbalized understanding of d/c instructions and prescriptions and follow up care. Pt denied any further requests.

## 2018-04-28 NOTE — ED Notes (Signed)
ED Provider at bedside. 

## 2018-04-28 NOTE — ED Provider Notes (Signed)
[facility]   [id number] 04/28/18 Arrival Time: 1145  EZ:MOQH THROAT  SUBJECTIVE: History from: patient.  Megan [name] is a 37 y.o. female who presents with abrupt onset of worsening sore throat x 4 days.  Denies positive sick exposure or precipitating event.  Has tried mucinex, salt water gargles, and ibuprofen without relief.  Symptoms are made worse with swallowing, difficulty with drinking liquids and own secretions.  Denies previous symptoms in the past.   Complains of subjective fever, nausea, change in voice, and increased drooling.  Denies chills, fatigue, sinus pain, rhinorrhea, cough, SOB, wheezing, chest pain, nausea, rash, changes in bowel or bladder habits.    ROS: As per HPI.  Past Medical History:  Diagnosis Date  . Anemia   . Obese   . Sickle cell trait (HCC)   . Thyroid disease    Past Surgical History:  Procedure Laterality Date  . CESAREAN SECTION WITH BILATERAL TUBAL LIGATION N/A 08/23/2013   Procedure: Primary Cesarean Section Delivery Baby Girl @ 2347, Apgars 8/9, Bilateral Tubal Ligation;  Surgeon: Antionette Char, MD;  Location: [facility];  Service: Obstetrics;  Laterality: N/A;  . FINGER NAIL SURGERY    . KNEE SURGERY    . WISDOM TOOTH EXTRACTION     No Known Allergies No current facility-administered medications on file prior to encounter.    Current Outpatient Medications on File Prior to Encounter  Medication Sig Dispense Refill  . citalopram (CELEXA) 10 MG tablet Take 1 tablet (10 mg total) by mouth at bedtime. For mood control (Patient not taking: Reported on 04/28/2018) 30 tablet 0  . traZODone (DESYREL) 50 MG tablet Take 1 tablet (50 mg total) by mouth at bedtime as needed for sleep. (Patient not taking: Reported on 04/28/2018) 30 tablet 0   Social History   Socioeconomic History  . Marital status: Single    Spouse name: Not on file  . Number of children: Not on file  . Years of education: Not on file  . Highest education level:  Not on file  Occupational History  . Not on file  Social Needs  . Financial resource strain: Not on file  . Food insecurity:    Worry: Not on file    Inability: Not on file  . Transportation needs:    Medical: Not on file    Non-medical: Not on file  Tobacco Use  . Smoking status: Current Some Day Smoker    Types: Cigarettes    Last attempt to quit: 01/01/2013    Years since quitting: 5.3  . Smokeless tobacco: Never Used  Substance and Sexual Activity  . Alcohol use: No  . Drug use: Yes    Frequency: 7.0 times per week    Types: Marijuana    Comment: 1 gram per day  . Sexual activity: Yes    Birth control/protection: None  Lifestyle  . Physical activity:    Days per week: Not on file    Minutes per session: Not on file  . Stress: Not on file  Relationships  . Social connections:    Talks on phone: Not on file    Gets together: Not on file    Attends religious service: Not on file    Active member of club or organization: Not on file    Attends meetings of clubs or organizations: Not on file    Relationship status: Not on file  . Intimate partner violence:    Fear of current or ex partner: Not on  file    Emotionally abused: Not on file    Physically abused: Not on file    Forced sexual activity: Not on file  Other Topics Concern  . Not on file  Social History Narrative  . Not on file   Family History  Problem Relation Age of Onset  . Hypertension Mother   . Diabetes Mother   . Heart disease Mother   . Sickle cell trait Mother     OBJECTIVE:  Vitals:   04/28/18 1206  BP: 135/82  Pulse: 97  Resp: 18  Temp: 98.3 F (36.8 C)  SpO2: 98%     General appearance: alert; appears fatigued; difficulty speaking with "hot potato voice;" spitting in cup HEENT: Ears: EACs clear, TMs pearly gray with visible cone of light, without erythema; Eyes: PERRL, EOMI grossly; Sinuses nontender to palpation; Nose: clear rhinorrhea; Throat: tonsils 3+ R>L with white tonsillar  exudates, uvula erythematous, swollen, and midline Neck: tender about the submandibular space, right sided anterior cervical chain LAD Lungs: CTA bilaterally without adventitious breath sounds Heart: regular rate and rhythm.  Radial pulses 2+ symmetrical bilaterally Skin: warm and dry Psychological: alert and cooperative; normal mood and affect  Labs: Results for orders placed or performed during the hospital encounter of 04/28/18 (from the past 24 hour(s))  POCT rapid strep A [facility])     Status: Abnormal   Collection Time: 04/28/18 12:28 PM  Result Value Ref Range   Streptococcus, Group A Screen (Direct) POSITIVE (A) NEGATIVE     ASSESSMENT & PLAN:  1. Strep pharyngitis   2. Tonsillitis   3. Change in voice     No orders of the defined types were placed in this encounter.  Concern for retropharyngeal abscess vs. Peritonsillar abscess  Recommending further evaluation and management in ED Patient aware and in agreement with this plan  Reviewed expectations re: course of current medical issues. Questions answered. Outlined signs and symptoms indicating need for more acute intervention. Patient verbalized understanding. After Visit Summary given.        Rennis Harding, PA-C 04/28/18 1301

## 2018-04-28 NOTE — ED Triage Notes (Signed)
Pt c/o severs sore throat since Wednesday. Pt difficulty speaking.

## 2019-02-12 ENCOUNTER — Encounter (HOSPITAL_COMMUNITY): Payer: Self-pay | Admitting: Emergency Medicine

## 2019-02-12 ENCOUNTER — Emergency Department (HOSPITAL_COMMUNITY)
Admission: EM | Admit: 2019-02-12 | Discharge: 2019-02-12 | Disposition: A | Discharge status: Home / Self Care | Payer: Self-pay | Attending: Emergency Medicine | Admitting: Emergency Medicine

## 2019-02-12 ENCOUNTER — Other Ambulatory Visit: Payer: Self-pay

## 2019-02-12 ENCOUNTER — Emergency Department (HOSPITAL_COMMUNITY): Payer: Self-pay

## 2019-02-12 DIAGNOSIS — F17210 Nicotine dependence, cigarettes, uncomplicated: Secondary | ICD-10-CM | POA: Insufficient documentation

## 2019-02-12 DIAGNOSIS — N760 Acute vaginitis: Secondary | ICD-10-CM | POA: Insufficient documentation

## 2019-02-12 DIAGNOSIS — R0789 Other chest pain: Secondary | ICD-10-CM

## 2019-02-12 DIAGNOSIS — N3000 Acute cystitis without hematuria: Secondary | ICD-10-CM | POA: Insufficient documentation

## 2019-02-12 DIAGNOSIS — F1410 Cocaine abuse, uncomplicated: Secondary | ICD-10-CM | POA: Insufficient documentation

## 2019-02-12 DIAGNOSIS — R1030 Lower abdominal pain, unspecified: Secondary | ICD-10-CM

## 2019-02-12 DIAGNOSIS — B9689 Other specified bacterial agents as the cause of diseases classified elsewhere: Secondary | ICD-10-CM

## 2019-02-12 DIAGNOSIS — F419 Anxiety disorder, unspecified: Secondary | ICD-10-CM | POA: Insufficient documentation

## 2019-02-12 DIAGNOSIS — D573 Sickle-cell trait: Secondary | ICD-10-CM | POA: Insufficient documentation

## 2019-02-12 LAB — CBC
HCT: 32.3 % — ABNORMAL LOW (ref 36.0–46.0)
Hemoglobin: 10.1 g/dL — ABNORMAL LOW (ref 12.0–15.0)
MCH: 21.1 pg — ABNORMAL LOW (ref 26.0–34.0)
MCHC: 31.3 g/dL (ref 30.0–36.0)
MCV: 67.4 fL — ABNORMAL LOW (ref 80.0–100.0)
Platelets: 370 10*3/uL (ref 150–400)
RBC: 4.79 MIL/uL (ref 3.87–5.11)
RDW: 17.7 % — ABNORMAL HIGH (ref 11.5–15.5)
WBC: 6.7 10*3/uL (ref 4.0–10.5)
nRBC: 0 % (ref 0.0–0.2)

## 2019-02-12 LAB — COMPREHENSIVE METABOLIC PANEL
ALT: 14 U/L (ref 0–44)
AST: 19 U/L (ref 15–41)
Albumin: 3.5 g/dL (ref 3.5–5.0)
Alkaline Phosphatase: 57 U/L (ref 38–126)
Anion gap: 8 (ref 5–15)
BUN: 5 mg/dL — ABNORMAL LOW (ref 6–20)
CO2: 22 mmol/L (ref 22–32)
Calcium: 9 mg/dL (ref 8.9–10.3)
Chloride: 109 mmol/L (ref 98–111)
Creatinine, Ser: 0.79 mg/dL (ref 0.44–1.00)
GFR calc Af Amer: >60 mL/min (ref >60)
GFR calc non Af Amer: >60 mL/min (ref >60)
Glucose, Bld: 104 mg/dL — ABNORMAL HIGH (ref 70–99)
Potassium: 3.5 mmol/L (ref 3.5–5.1)
Sodium: 139 mmol/L (ref 135–145)
Total Bilirubin: 0.5 mg/dL (ref 0.3–1.2)
Total Protein: 6.9 g/dL (ref 6.5–8.1)

## 2019-02-12 LAB — LIPASE, BLOOD: Lipase: 33 U/L (ref 11–51)

## 2019-02-12 LAB — URINALYSIS, ROUTINE W REFLEX MICROSCOPIC
Bilirubin Urine: NEGATIVE
Glucose, UA: NEGATIVE mg/dL
Ketones, ur: NEGATIVE mg/dL
Nitrite: POSITIVE — AB
Protein, ur: 30 mg/dL — AB
Specific Gravity, Urine: 1.01 (ref 1.005–1.030)
pH: 7 (ref 5.0–8.0)

## 2019-02-12 LAB — WET PREP, GENITAL
Sperm: NONE SEEN
Trich, Wet Prep: NONE SEEN
Yeast Wet Prep HPF POC: NONE SEEN

## 2019-02-12 LAB — I-STAT BETA HCG BLOOD, ED (MC, WL, AP ONLY): I-stat hCG, quantitative: <5 m[IU]/mL (ref <5)

## 2019-02-12 LAB — TROPONIN I: Troponin I: <0.03 ng/mL (ref <0.03)

## 2019-02-12 MED ORDER — SODIUM CHLORIDE 0.9% FLUSH: 3.0000 mL | Freq: Once | INTRAVENOUS | Status: DC

## 2019-02-12 MED ORDER — METRONIDAZOLE 500 MG PO TABS: 500.0000 mg | ORAL_TABLET | Freq: Two times a day (BID) | ORAL | 0 refills | Status: DC

## 2019-02-12 MED ORDER — HYDROXYZINE HCL 25 MG PO TABS: 25.0000 mg | ORAL_TABLET | Freq: Four times a day (QID) | ORAL | 0 refills | Status: DC | PRN

## 2019-02-12 MED ORDER — HYDROXYZINE HCL 25 MG PO TABS
25.0000 mg | ORAL_TABLET | Freq: Once | ORAL | Status: AC
  Administered 2019-02-12: 25 mg via ORAL
  Filled 2019-02-12: qty 1

## 2019-02-12 MED ORDER — CEPHALEXIN 500 MG PO CAPS: 500.0000 mg | ORAL_CAPSULE | Freq: Three times a day (TID) | ORAL | 0 refills | Status: AC

## 2019-02-12 MED ORDER — CEPHALEXIN 250 MG PO CAPS
500.0000 mg | ORAL_CAPSULE | Freq: Once | ORAL | Status: AC
  Administered 2019-02-12: 500 mg via ORAL
  Filled 2019-02-12: qty 2

## 2019-02-12 NOTE — ED Provider Notes (Signed)
[facility] Provider Note   CSN: [id number] Arrival date & time: 02/12/19  1550    History   Chief Complaint Chief Complaint  Patient presents with   Chest Tightness   Abdominal Pain    HPI Megan [name] is a 38 y.o. female.     Megan [name] is a 38 y.o. female with a history of anemia, sickle cell trait, thyroid disease, substance-induced mood disorder, who presents to the emergency department for evaluation of chest tightness, shortness of breath and lower abdominal pain.  Patient reports she got into a verbal altercation with her boyfriend today and was feeling very anxious and upset and had sudden onset of chest tightness and shortness of breath.  She does not have history of previous panic attacks but thinks this is what occurred today.  Police officers were called and arrived on the scene and this seemed to worsen her anxiety and subsequently the symptoms.  She reports that when she was out of the situation and able to calm down and her chest tightness and shortness of breath have since resolved and she has had no return of symptoms.  She denies associated fevers or cough.  No prior history of chest pain and no history of heart disease.  No history of DVT or PE, she is not on estrogen containing birth control, no recent long distance travel or surgeries.  Patient also reports that over the last 2 weeks she has had pain across her lower abdomen that seems to come and go but it is been worse over the past few days.  She denies associated nausea and vomiting, again no fevers.  She has not had diarrhea or constipation.  Does note some urinary frequency but no dysuria.  She has not had vaginal bleeding, reports she has noticed some discharge.  Active with her boyfriend but they do not always use protection.  Patient took a home pregnancy test about a week ago but reports the test malfunctioned and did not give her a result.     Past  Medical History:  Diagnosis Date   Anemia    Obese    Sickle cell trait (HCC)    Thyroid disease     Patient Active Problem List   Diagnosis Date Noted   Cocaine use disorder, mild, abuse (HCC)    Cocaine-induced mood disorder (HCC)    Moderate cocaine use disorder (HCC)    Major depressive disorder, single episode, severe without psychosis (HCC) 12/23/2017   Cesarean delivery delivered 08/24/2013   Maternal anemia complicating pregnancy, childbirth, or the puerperium 08/24/2013   Asymptomatic bacteriuria in pregnancy 08/24/2013   Normal delivery at term 08/23/2013   Fetal heart rate decelerations affecting management of mother 08/23/2013   Gestational hypertension 08/19/2013   Obesity complicating pregnancy in third trimester 08/19/2013   Grand multipara 08/19/2013    Past Surgical History:  Procedure Laterality Date   CESAREAN SECTION WITH BILATERAL TUBAL LIGATION N/A 08/23/2013   Procedure: Primary Cesarean Section Delivery Baby Girl @ 2347, Apgars 8/9, Bilateral Tubal Ligation;  Surgeon: Antionette CharLisa Jackson-Moore, MD;  Location: [facility];  Service: Obstetrics;  Laterality: N/A;   FINGER NAIL SURGERY     KNEE SURGERY     WISDOM TOOTH EXTRACTION       OB History    Gravida  5   Para  5   Term  4   Preterm  1   AB      Living  5  SAB      TAB      Ectopic      Multiple      Live Births  5            Home Medications    Prior to Admission medications   Medication Sig Start Date End Date Taking? Authorizing Provider  clindamycin (CLEOCIN) 300 MG capsule Take 1 capsule (300 mg total) by mouth 4 (four) times daily. X 7 days 04/28/18   Mesner, Barbara CowerJason, MD  dexamethasone (DECADRON) 6 MG tablet Take 1.5 tablets (9 mg total) by mouth daily. 04/28/18   Mesner, Barbara CowerJason, MD  guaiFENesin (MUCINEX) 600 MG 12 hr tablet Take 600 mg by mouth 2 (two) times daily.    [provider]  ibuprofen (ADVIL,MOTRIN) 200 MG tablet Take 600 mg by mouth every  6 (six) hours as needed for moderate pain.    [provider]    Family History Family History  Problem Relation Age of Onset   Hypertension Mother    Diabetes Mother    Heart disease Mother    Sickle cell trait Mother     Social History Social History   Tobacco Use   Smoking status: Current Some Day Smoker    Types: Cigarettes    Last attempt to quit: 01/01/2013    Years since quitting: 6.1   Smokeless tobacco: Never Used  Substance Use Topics   Alcohol use: No   Drug use: Yes    Frequency: 7.0 times per week    Types: Marijuana    Comment: 1 gram per day     Allergies   Patient has no known allergies.   Review of Systems Review of Systems  Constitutional: Negative for chills and fever.  HENT: Negative.   Eyes: Negative for visual disturbance.  Respiratory: Positive for chest tightness and shortness of breath. Negative for cough.   Cardiovascular: Positive for chest pain. Negative for palpitations and leg swelling.  Gastrointestinal: Positive for abdominal pain. Negative for blood in stool, constipation, diarrhea, nausea and vomiting.  Genitourinary: Positive for vaginal discharge. Negative for dysuria, flank pain, frequency, hematuria and vaginal bleeding.  Musculoskeletal: Negative for arthralgias, back pain and myalgias.  Skin: Negative for color change and rash.  Neurological: Negative for syncope and light-headedness.  All other systems reviewed and are negative.    Physical Exam Updated Vital Signs BP (!) 144/87 (BP Location: Right Arm)    Pulse 85    Temp 98.5 F (36.9 C) (Oral)    Resp 16    LMP 12/14/2018 (Approximate)    SpO2 100%   Physical Exam Vitals signs and nursing note reviewed. Exam conducted with a chaperone present.  Constitutional:      General: She is not in acute distress.    Appearance: She is well-developed and normal weight. She is not ill-appearing or diaphoretic.  HENT:     Head: Normocephalic and atraumatic.      Mouth/Throat:     Mouth: Mucous membranes are moist.     Pharynx: Oropharynx is clear.  Eyes:     General:        Right eye: No discharge.        Left eye: No discharge.     Pupils: Pupils are equal, round, and reactive to light.  Neck:     Musculoskeletal: Neck supple.  Cardiovascular:     Rate and Rhythm: Normal rate and regular rhythm.     Heart sounds: Normal heart sounds.  Pulmonary:  Effort: Pulmonary effort is normal. No respiratory distress.     Breath sounds: Normal breath sounds. No wheezing or rales.     Comments: Respirations equal and unlabored, patient able to speak in full sentences, lungs clear to auscultation bilaterally Abdominal:     General: Abdomen is flat. Bowel sounds are normal. There is no distension.     Palpations: Abdomen is soft. There is no mass.     Tenderness: There is abdominal tenderness in the right lower quadrant, suprapubic area and left lower quadrant. There is no guarding.     Comments: Abdomen is soft and nondistended, bowel sounds present throughout.  There is some tenderness across the lower abdomen without guarding or peritoneal signs.  Pain is not well localized on one side.  No CVA tenderness bilaterally.  Genitourinary:    Comments: Chaperone present during pelvic exam.  No external genital lesions noted. Speculum exam reveals small amount of white discharge, no cervical erythema, no vaginal bleeding.  On bimanual exam there is no cervical motion tenderness, patient does have some tenderness over the right adnexa without palpable masses.  No other aggravating or alleviating factors. Musculoskeletal:        General: No deformity.  Skin:    General: Skin is warm and dry.     Capillary Refill: Capillary refill takes less than 2 seconds.  Neurological:     Mental Status: She is alert.     Coordination: Coordination normal.     Comments: Speech is clear, able to follow commands Moves extremities without ataxia, coordination intact     Psychiatric:        Mood and Affect: Mood normal.        Behavior: Behavior normal.      ED Treatments / Results  Labs (all labs ordered are listed, but only abnormal results are displayed) Labs Reviewed  COMPREHENSIVE METABOLIC PANEL - Abnormal; Notable for the following components:      Result Value   Glucose, Bld 104 (*)    BUN 5 (*)    All other components within normal limits  CBC - Abnormal; Notable for the following components:   Hemoglobin 10.1 (*)    HCT 32.3 (*)    MCV 67.4 (*)    MCH 21.1 (*)    RDW 17.7 (*)    All other components within normal limits  WET PREP, GENITAL  LIPASE, BLOOD  TROPONIN I  URINALYSIS, ROUTINE W REFLEX MICROSCOPIC  RPR  HIV ANTIBODY (ROUTINE TESTING W REFLEX)  I-STAT BETA HCG BLOOD, ED ([facility], [facility], [facility] ONLY)  GC/CHLAMYDIA PROBE AMP ([place]) NOT AT [facility]    EKG EKG Interpretation  Date/Time:  Wednesday February 12 2019 16:15:53 EDT Ventricular Rate:  84 PR Interval:  164 QRS Duration: 74 QT Interval:  362 QTC Calculation: 427 R Axis:   65 Text Interpretation:  Normal sinus rhythm Nonspecific T wave abnormality Abnormal ECG No old tracing to compare Confirmed by Lorre Nick ([id number]) on 02/12/2019 7:12:10 PM   Radiology US Transvaginal Non-ob  Result Date: 02/12/2019 CLINICAL DATA:  In the right lower abdominal pain. EXAM: TRANSABDOMINAL AND TRANSVAGINAL ULTRASOUND OF PELVIS DOPPLER ULTRASOUND OF OVARIES TECHNIQUE: Both transabdominal and transvaginal ultrasound examinations of the pelvis were performed. Transabdominal technique was performed for global imaging of the pelvis including uterus, ovaries, adnexal regions, and pelvic cul-de-sac. It was necessary to proceed with endovaginal exam following the transabdominal exam to visualize the ovaries. Color and duplex Doppler ultrasound was utilized to evaluate blood flow  to the ovaries. COMPARISON:  None. FINDINGS: Uterus Measurements: 9.7 x 5.4 x 6.4 cm = volume: 178 mL. No fibroids or  other mass visualized. Endometrium Thickness: 8.7 mm.  No focal abnormality visualized. Right ovary Measurements: 2.7 x 1.6 x 1.7 cm = volume: 3.8 mL. Normal appearance/no adnexal mass. Left ovary Measurements: 2.4 x 1.8 x 2 cm = volume: 4.23 mL. Normal appearance/no adnexal mass. Pulsed Doppler evaluation of both ovaries demonstrates normal low-resistance arterial and venous waveforms. Other findings No abnormal free fluid. IMPRESSION: Normal study.  No findings to explain the patient's pain. Electronically Signed   By: Katherine Mantlehristopher  Green M.D.   On: 02/12/2019 21:16   [place] Pelvis Complete  Result Date: 02/12/2019 CLINICAL DATA:  In the right lower abdominal pain. EXAM: TRANSABDOMINAL AND TRANSVAGINAL ULTRASOUND OF PELVIS DOPPLER ULTRASOUND OF OVARIES TECHNIQUE: Both transabdominal and transvaginal ultrasound examinations of the pelvis were performed. Transabdominal technique was performed for global imaging of the pelvis including uterus, ovaries, adnexal regions, and pelvic cul-de-sac. It was necessary to proceed with endovaginal exam following the transabdominal exam to visualize the ovaries. Color and duplex Doppler ultrasound was utilized to evaluate blood flow to the ovaries. COMPARISON:  None. FINDINGS: Uterus Measurements: 9.7 x 5.4 x 6.4 cm = volume: 178 mL. No fibroids or other mass visualized. Endometrium Thickness: 8.7 mm.  No focal abnormality visualized. Right ovary Measurements: 2.7 x 1.6 x 1.7 cm = volume: 3.8 mL. Normal appearance/no adnexal mass. Left ovary Measurements: 2.4 x 1.8 x 2 cm = volume: 4.23 mL. Normal appearance/no adnexal mass. Pulsed Doppler evaluation of both ovaries demonstrates normal low-resistance arterial and venous waveforms. Other findings No abnormal free fluid. IMPRESSION: Normal study.  No findings to explain the patient's pain. Electronically Signed   By: Katherine Mantlehristopher  Green M.D.   On: 02/12/2019 21:16   [place] Art/ven Flow Abd Pelv Doppler  Result Date:  02/12/2019 CLINICAL DATA:  In the right lower abdominal pain. EXAM: TRANSABDOMINAL AND TRANSVAGINAL ULTRASOUND OF PELVIS DOPPLER ULTRASOUND OF OVARIES TECHNIQUE: Both transabdominal and transvaginal ultrasound examinations of the pelvis were performed. Transabdominal technique was performed for global imaging of the pelvis including uterus, ovaries, adnexal regions, and pelvic cul-de-sac. It was necessary to proceed with endovaginal exam following the transabdominal exam to visualize the ovaries. Color and duplex Doppler ultrasound was utilized to evaluate blood flow to the ovaries. COMPARISON:  None. FINDINGS: Uterus Measurements: 9.7 x 5.4 x 6.4 cm = volume: 178 mL. No fibroids or other mass visualized. Endometrium Thickness: 8.7 mm.  No focal abnormality visualized. Right ovary Measurements: 2.7 x 1.6 x 1.7 cm = volume: 3.8 mL. Normal appearance/no adnexal mass. Left ovary Measurements: 2.4 x 1.8 x 2 cm = volume: 4.23 mL. Normal appearance/no adnexal mass. Pulsed Doppler evaluation of both ovaries demonstrates normal low-resistance arterial and venous waveforms. Other findings No abnormal free fluid. IMPRESSION: Normal study.  No findings to explain the patient's pain. Electronically Signed   By: Katherine Mantlehristopher  Green M.D.   On: 02/12/2019 21:16    Procedures Procedures (including critical care time)  Medications Ordered in ED Medications  sodium chloride flush (NS) 0.9 % injection 3 mL (3 mLs Intravenous Not Given 02/12/19 1924)  hydrOXYzine (ATARAX/VISTARIL) tablet 25 mg (25 mg Oral Given 02/12/19 1838)     Initial Impression / Assessment and Plan / ED Course  I have reviewed the triage vital signs and the nursing notes.  Pertinent labs & imaging results that were available during my care of the patient were reviewed by  me and considered in my medical decision making (see chart for details).  Patient presents for evaluation of sudden onset chest tightness and shortness of breath which occurred  while she was feeling anxious and in a verbal altercation, police arrived her symptoms worsen but when she was out of the situation she was able to calm down and her symptoms resolved.  Her EKG shows normal sinus rhythm with no ischemic changes, and her troponin is negative, her chest x-ray is clear and she is currently symptom-free.  I suspect this was related to anxiety in the setting of altercation, and patient agrees to think she likely had a panic attack today.  Will give hydroxyzine here in the ED.  Patient has also been experiencing lower abdominal pain intermittently for the past 2 weeks she has had some urinary frequency no flank pain, no fevers has also had some vaginal discharge.  Pain is not well localized to one side.  On pelvic exam there was a small amount of white discharge present patient did have some tenderness over the right adnexa without palpable mass, no cervical motion tenderness to suggest PID.  Wet prep and STD testing collected.  Abdominal labs overall reassuring, chronic anemia is at baseline, no leukocytosis, no acute electrolyte derangements, normal renal and liver function normal lipase.  Urinalysis does show signs of infection with positive nitrates and leukocytes, WBCs and bacteria present I suspect this UTI may be the cause of her abdominal pain.  Her wet prep also shows BV.  Will treat with Keflex and Flagyl.  Given right adnexal tenderness will get pelvic ultrasound to rule out ovarian cyst or torsion.  I have low suspicion for diverticulitis, appendicitis or other acute intra-abdominal cause for pain as patient's tenderness is very much in the pelvic region.  [place] ultrasound reassuring with no evidence of ovarian cysts or torsion.  Discussed results with patient.  Will treat with antibiotics, will also give prescription for hydroxyzine for anxiety she is aware she has STD testing pending will be called with positive results.  Return precautions discussed.  Patient expresses  understanding and agreement with plan.  Discharged home in good condition.   Final diagnoses:  Lower abdominal pain  Acute cystitis without hematuria  Chest tightness  Anxiety  BV (bacterial vaginosis)    ED Discharge Orders         Ordered    cephALEXin (KEFLEX) 500 MG capsule  3 times daily     02/12/19 2238    metroNIDAZOLE (FLAGYL) 500 MG tablet  2 times daily     02/12/19 2238    hydrOXYzine (ATARAX/VISTARIL) 25 MG tablet  Every 6 hours PRN     02/12/19 2238           Jacqlyn Larsen, PA-C 02/14/19 1649    Lacretia Leigh, MD 02/17/19 1025

## 2019-02-12 NOTE — ED Notes (Signed)
Pt transported to Ultrasound.  

## 2019-02-12 NOTE — Discharge Instructions (Addendum)
Take Keflex and Flagyl as directed, do not drink alcohol while taking these antibiotics.  You have a urinary tract infection but your labs are otherwise reassuring.  You do have STD testing pending you will be called by phone if any of your results are positive.  Your pelvic ultrasound looks good today.  I think that the chest pain and shortness of breath you experienced today was likely due to anxiety during your argument.  You can use hydroxyzine as needed for anxiety.  Return to the emergency department for new or worsening symptoms.

## 2019-02-12 NOTE — ED Triage Notes (Signed)
Patient reports sudden onset chest tightness with shortness of breath after arguing with boyfriend earlier today - she states police officers showed up and her symptoms became worse then - currently denies chest pain or shortness of breath but endorses bilateral lower abdominal pain x 2-4 weeks with N/V off and on. Denies fevers/chills, diarrhea, urinary symptoms.   EMS VS: 125/59, HR 94 NSR, RR 18, 99% RA. 18g. PIV LAC. Given 324 ASA PTA.

## 2019-02-12 NOTE — ED Notes (Signed)
Pt ambulatory to restroom independently without difficulty.

## 2019-02-13 LAB — HIV ANTIBODY (ROUTINE TESTING W REFLEX): HIV Screen 4th Generation wRfx: NONREACTIVE

## 2019-02-13 LAB — RPR: RPR Ser Ql: NONREACTIVE

## 2019-02-14 LAB — GC/CHLAMYDIA PROBE AMP (~~LOC~~) NOT AT ARMC
Chlamydia: NEGATIVE
Neisseria Gonorrhea: NEGATIVE

## 2019-09-15 ENCOUNTER — Other Ambulatory Visit: Payer: Self-pay

## 2019-09-22 ENCOUNTER — Ambulatory Visit: Payer: MEDICAID | Attending: Internal Medicine

## 2019-09-22 DIAGNOSIS — Z20822 Contact with and (suspected) exposure to covid-19: Secondary | ICD-10-CM

## 2019-09-23 LAB — NOVEL CORONAVIRUS, NAA: SARS-CoV-2, NAA: NOT DETECTED

## 2019-12-16 ENCOUNTER — Encounter (HOSPITAL_COMMUNITY): Payer: Self-pay | Admitting: Emergency Medicine

## 2019-12-16 ENCOUNTER — Emergency Department (HOSPITAL_COMMUNITY)
Admission: EM | Admit: 2019-12-16 | Discharge: 2019-12-16 | Disposition: A | Discharge status: Home / Self Care | Payer: Self-pay | Attending: Emergency Medicine | Admitting: Emergency Medicine

## 2019-12-16 ENCOUNTER — Other Ambulatory Visit: Payer: Self-pay

## 2019-12-16 DIAGNOSIS — Y9389 Activity, other specified: Secondary | ICD-10-CM | POA: Insufficient documentation

## 2019-12-16 DIAGNOSIS — S51811A Laceration without foreign body of right forearm, initial encounter: Secondary | ICD-10-CM

## 2019-12-16 DIAGNOSIS — Y93E6 Activity, residential relocation: Secondary | ICD-10-CM | POA: Insufficient documentation

## 2019-12-16 DIAGNOSIS — E079 Disorder of thyroid, unspecified: Secondary | ICD-10-CM | POA: Insufficient documentation

## 2019-12-16 DIAGNOSIS — Y999 Unspecified external cause status: Secondary | ICD-10-CM | POA: Insufficient documentation

## 2019-12-16 DIAGNOSIS — Y929 Unspecified place or not applicable: Secondary | ICD-10-CM | POA: Insufficient documentation

## 2019-12-16 DIAGNOSIS — F1290 Cannabis use, unspecified, uncomplicated: Secondary | ICD-10-CM | POA: Insufficient documentation

## 2019-12-16 DIAGNOSIS — W260XXA Contact with knife, initial encounter: Secondary | ICD-10-CM | POA: Insufficient documentation

## 2019-12-16 DIAGNOSIS — Z79899 Other long term (current) drug therapy: Secondary | ICD-10-CM | POA: Insufficient documentation

## 2019-12-16 DIAGNOSIS — Z87891 Personal history of nicotine dependence: Secondary | ICD-10-CM | POA: Insufficient documentation

## 2019-12-16 DIAGNOSIS — Z23 Encounter for immunization: Secondary | ICD-10-CM | POA: Insufficient documentation

## 2019-12-16 MED ORDER — TETANUS-DIPHTH-ACELL PERTUSSIS 5-2.5-18.5 LF-MCG/0.5 IM SUSP
0.5000 mL | Freq: Once | INTRAMUSCULAR | Status: AC
  Administered 2019-12-16: 0.5 mL via INTRAMUSCULAR
  Filled 2019-12-16: qty 0.5

## 2019-12-16 MED ORDER — LIDOCAINE-EPINEPHRINE (PF) 2 %-1:200000 IJ SOLN
10.0000 mL | Freq: Once | INTRAMUSCULAR | Status: AC
  Administered 2019-12-16: 10 mL via INTRADERMAL
  Filled 2019-12-16: qty 20

## 2019-12-16 NOTE — ED Provider Notes (Signed)
Megan [name], Megan [name] EMERGENCY DEPARTMENT Provider Note   CSN: [id number] Arrival date & time: 12/16/19  1953     History Chief Complaint  Patient presents with  . Laceration    Megan [name] is a 39 y.o. female.  39 yo F with a chief complaint of a laceration to her left lateral forearm.  Patient states that she is moving and she had a razor blade that was sticking out of something that she was trying to carry and she ended up rubbing her arm against the blade.  She denies any other areas of injury.  States that she was bleeding quite a lot at the beginning but it stopped.  Unsure of her tetanus.  The history is provided by the patient.  Laceration Location:  Shoulder/arm Shoulder/arm laceration location:  R forearm Length:  8 Depth:  Through dermis Quality: straight   Bleeding: controlled   Time since incident:  6 hours Laceration mechanism:  Razor Pain details:    Quality:  Burning   Severity:  Severe   Timing:  Constant   Progression:  Unchanged Foreign body present:  No foreign bodies Relieved by:  Nothing Worsened by:  Movement and pressure Ineffective treatments:  None tried Tetanus status:  Unknown Associated symptoms: no fever        Past Medical History:  Diagnosis Date  . Anemia   . Obese   . Sickle cell trait (HCC)   . Thyroid disease     Patient Active Problem List   Diagnosis Date Noted  . Cocaine use disorder, mild, abuse (HCC)   . Cocaine-induced mood disorder (HCC)   . Moderate cocaine use disorder (HCC)   . Major depressive disorder, single episode, severe without psychosis (HCC) 12/23/2017  . Cesarean delivery delivered 08/24/2013  . Maternal anemia complicating pregnancy, childbirth, or the puerperium 08/24/2013  . Asymptomatic bacteriuria in pregnancy 08/24/2013  . Normal delivery at term 08/23/2013  . Fetal heart rate decelerations affecting management of mother 08/23/2013  . Gestational hypertension 08/19/2013  .  Obesity complicating pregnancy in third trimester 08/19/2013  . Grand multipara 08/19/2013    Past Surgical History:  Procedure Laterality Date  . CESAREAN SECTION WITH BILATERAL TUBAL LIGATION N/A 08/23/2013   Procedure: Primary Cesarean Section Delivery Baby Girl @ 2347, Apgars 8/9, Bilateral Tubal Ligation;  Surgeon: Antionette Char, MD;  Location: [facility];  Service: Obstetrics;  Laterality: N/A;  . FINGER NAIL SURGERY    . KNEE SURGERY    . WISDOM TOOTH EXTRACTION       OB History    Gravida  5   Para  5   Term  4   Preterm  1   AB      Living  5     SAB      TAB      Ectopic      Multiple      Live Births  5           Family History  Problem Relation Age of Onset  . Hypertension Mother   . Diabetes Mother   . Heart disease Mother   . Sickle cell trait Mother     Social History   Tobacco Use  . Smoking status: Current Some Day Smoker    Types: Cigarettes    Last attempt to quit: 01/01/2013    Years since quitting: 6.9  . Smokeless tobacco: Never Used  Substance Use Topics  . Alcohol use: No  . Drug  use: Yes    Frequency: 7.0 times per week    Types: Marijuana    Comment: 1 gram per day    Home Medications Prior to Admission medications   Medication Sig Start Date End Date Taking? Authorizing Provider  clindamycin (CLEOCIN) 300 MG capsule Take 1 capsule (300 mg total) by mouth 4 (four) times daily. X 7 days Patient not taking: Reported on 12/16/2019 04/28/18   Mesner, Corene Cornea, MD  dexamethasone (DECADRON) 6 MG tablet Take 1.5 tablets (9 mg total) by mouth daily. Patient not taking: Reported on 12/16/2019 04/28/18   Mesner, Corene Cornea, MD  hydrOXYzine (ATARAX/VISTARIL) 25 MG tablet Take 1 tablet (25 mg total) by mouth every 6 (six) hours as needed for anxiety. Patient not taking: Reported on 12/16/2019 02/12/19   Jacqlyn Larsen, PA-C  metroNIDAZOLE (FLAGYL) 500 MG tablet Take 1 tablet (500 mg total) by mouth 2 (two) times daily. One po bid x 7  days Patient not taking: Reported on 12/16/2019 02/12/19   Jacqlyn Larsen, PA-C    Allergies    Patient has no known allergies.  Review of Systems   Review of Systems  Constitutional: Negative for chills and fever.  HENT: Negative for congestion and rhinorrhea.   Eyes: Negative for redness and visual disturbance.  Respiratory: Negative for shortness of breath and wheezing.   Cardiovascular: Negative for chest pain and palpitations.  Gastrointestinal: Negative for nausea and vomiting.  Genitourinary: Negative for dysuria and urgency.  Musculoskeletal: Negative for arthralgias and myalgias.  Skin: Positive for wound. Negative for pallor.  Neurological: Negative for dizziness and headaches.    Physical Exam Updated Vital Signs BP (!) 128/94 (BP Location: Left Arm)   Pulse 88   Temp 98.3 F (36.8 C) (Oral)   Resp 17   Ht 5\' 3"  (1.6 m)   Wt 108 kg   SpO2 98%   BMI 42.18 kg/m   Physical Exam Vitals and nursing note reviewed.  Constitutional:      General: She is not in acute distress.    Appearance: She is well-developed. She is not diaphoretic.  HENT:     Head: Normocephalic and atraumatic.  Eyes:     Pupils: Pupils are equal, round, and reactive to light.  Cardiovascular:     Rate and Rhythm: Normal rate and regular rhythm.     Heart sounds: No murmur. No friction rub. No gallop.   Pulmonary:     Effort: Pulmonary effort is normal.     Breath sounds: No wheezing or rales.  Abdominal:     General: There is no distension.     Palpations: Abdomen is soft.     Tenderness: There is no abdominal tenderness.  Musculoskeletal:        General: No tenderness.     Cervical back: Normal range of motion and neck supple.     Comments: Approximately 8 cm laceration to the lateral aspect of the right forearm.  Gaping.  Just through the dermis.  Skin:    General: Skin is warm and dry.  Neurological:     Mental Status: She is alert and oriented to person, place, and time.   Psychiatric:        Behavior: Behavior normal.     ED Results / Procedures / Treatments   Labs (all labs ordered are listed, but only abnormal results are displayed) Labs Reviewed - No data to display  EKG None  Radiology No results found.  Procedures .Marland KitchenLaceration Repair  Date/Time: 12/16/2019  10:11 PM Performed by: Melene Plan, DO Authorized by: Melene Plan, DO   Consent:    Consent obtained:  Verbal   Consent given by:  Patient   Risks discussed:  Infection, pain, poor cosmetic result and poor wound healing   Alternatives discussed:  No treatment, observation and delayed treatment Anesthesia (see MAR for exact dosages):    Anesthesia method:  Local infiltration   Local anesthetic:  Lidocaine 2% WITH epi Laceration details:    Location:  Shoulder/arm   Shoulder/arm location:  R lower arm   Length (cm):  8 Repair type:    Repair type:  Simple Exploration:    Hemostasis achieved with:  Direct pressure and epinephrine   Wound exploration: entire depth of wound probed and visualized     Wound extent: no muscle damage noted and no tendon damage noted     Contaminated: no   Treatment:    Area cleansed with:  Saline   Amount of cleaning:  Extensive   Irrigation solution:  Sterile saline   Irrigation volume:  75   Irrigation method:  Syringe   Visualized foreign bodies/material removed: no   Skin repair:    Repair method:  Sutures   Suture size:  4-0   Suture material:  Nylon   Suture technique:  Simple interrupted   Number of sutures:  8 Approximation:    Approximation:  Close Post-procedure details:    Dressing:  Antibiotic ointment and bulky dressing   Patient tolerance of procedure:  Tolerated well, no immediate complications   (including critical [name] time)  Medications Ordered in ED Medications  lidocaine-EPINEPHrine (XYLOCAINE W/EPI) 2 %-1:200000 (PF) injection 10 mL (has no administration in time range)  Tdap (BOOSTRIX) injection 0.5 mL (has no  administration in time range)    ED Course  I have reviewed the triage vital signs and the nursing notes.  Pertinent labs & imaging results that were available during my [name] of the patient were reviewed by me and considered in my medical decision making (see chart for details).    MDM Rules/Calculators/A&P                      39 yo F with a chief complaint of a laceration to her right forearm.  This occurred with an incidental contact with a razor while she was moving.  Will suture and clean at bedside.  Discharge home.  10:12 PM:  I have discussed the diagnosis/risks/treatment options with the patient and believe the pt to be eligible for discharge home to follow-up with PCP. We also discussed returning to the ED immediately if new or worsening sx occur. We discussed the sx which are most concerning (e.g., redness, drainage, fever) that necessitate immediate return. Medications administered to the patient during their visit and any new prescriptions provided to the patient are listed below.  Medications given during this visit Medications  lidocaine-EPINEPHrine (XYLOCAINE W/EPI) 2 %-1:200000 (PF) injection 10 mL (has no administration in time range)  Tdap (BOOSTRIX) injection 0.5 mL (has no administration in time range)     The patient appears reasonably screen and/or stabilized for discharge and I doubt any other medical condition or other [facility] requiring further screening, evaluation, or treatment in the ED at this time prior to discharge.   Final Clinical Impression(s) / ED Diagnoses Final diagnoses:  Laceration of right forearm, initial encounter    Rx / DC Orders ED Discharge Orders    None  Melene Plan, DO 12/16/19 2212

## 2019-12-16 NOTE — ED Triage Notes (Signed)
Pt c/o laceration on right posterior elbow, bleeding controlled, dressing applied, pt refuses to have it clean at triage, prefer to have provider to see it first.

## 2019-12-16 NOTE — ED Notes (Signed)
Per EDP, bacitracin applied, non adherent dressing, with gauze wrapping.

## 2019-12-16 NOTE — Discharge Instructions (Signed)
The area is fine to get wet and let water run over it but do not fully immerse it underwater i.e. no swimming or scuba diving.  Do not scrub the area because this could break the stitches.  Please apply something moist to the area typically Vaseline bacitracin or Neosporin a couple times a day.  Please return for redness drainage or fever.  These do not dissolve they need to be removed, typically this happens between 7 and 10 days.  This can be done at the family doctor's office at urgent care you can come back here if you like.

## 2019-12-16 NOTE — ED Notes (Signed)
Patient verbalizes understanding of discharge instructions. Opportunity for questioning and answers were provided. Armband removed by staff, pt discharged from ED.  

## 2020-06-21 IMAGING — CT CT NECK W/ CM
3 of 4 series · 13 of 33 positions shown, 16 images · IV contrast (Omni 300)
Comparison: Maxillofacial CT 03/17/2008

CLINICAL DATA: Severe sore throat since [REDACTED]. Difficulty
speaking.

EXAM:
CT NECK WITH CONTRAST
TECHNIQUE: Multidetector CT imaging of the neck was performed using the
standard protocol following the bolus administration of intravenous
contrast.
CONTRAST:  75mL OMNIPAQUE IOHEXOL 300 MG/ML  SOLN

[Series 3: neck 2.0 st · axial · 0.60mm/px · z∈[-231,-67]mm · 5 of 124 slices shown, 7 images (1 of 3)]
[im 21/124  soft-tissue]
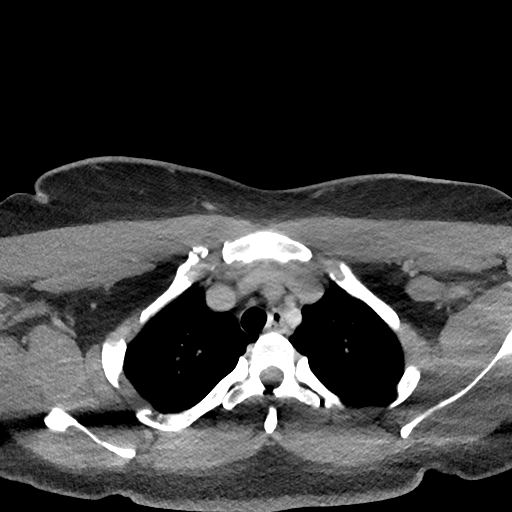
[im 21/124  bone]
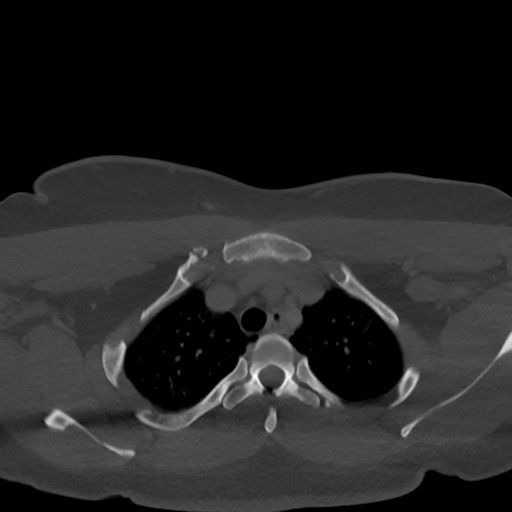
[im 42/124  bone]
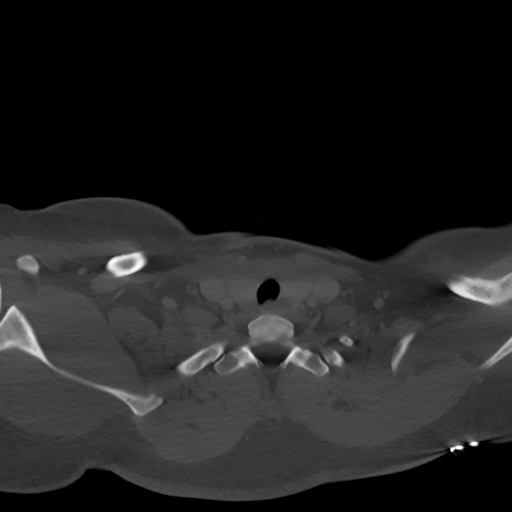
[im 62/124  bone]
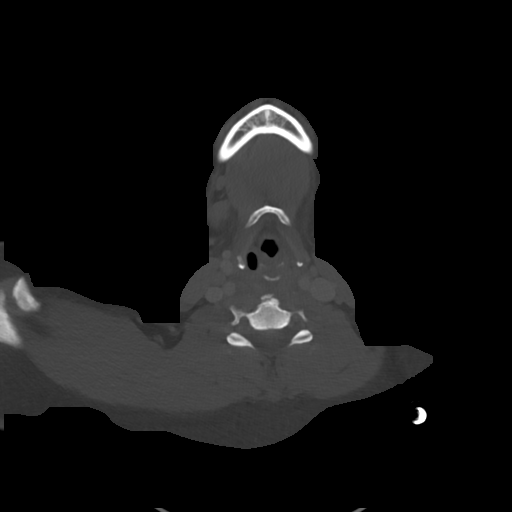
[im 83/124  bone]
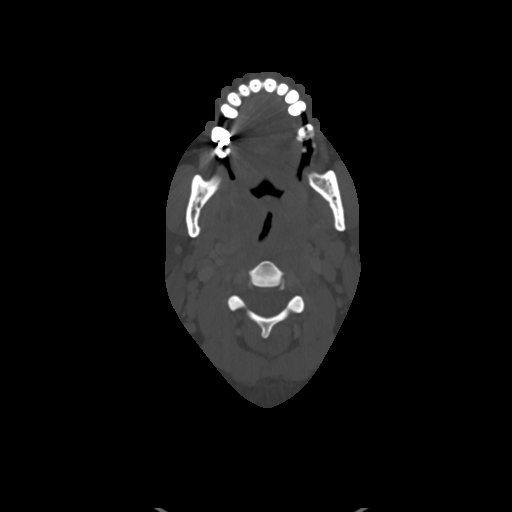
[im 103/124  soft-tissue]
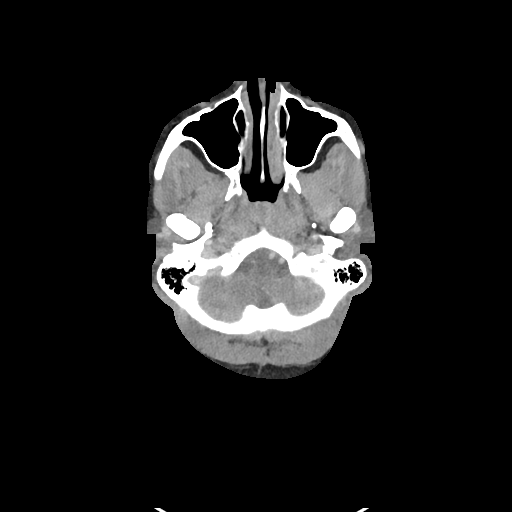
[im 103/124  bone]
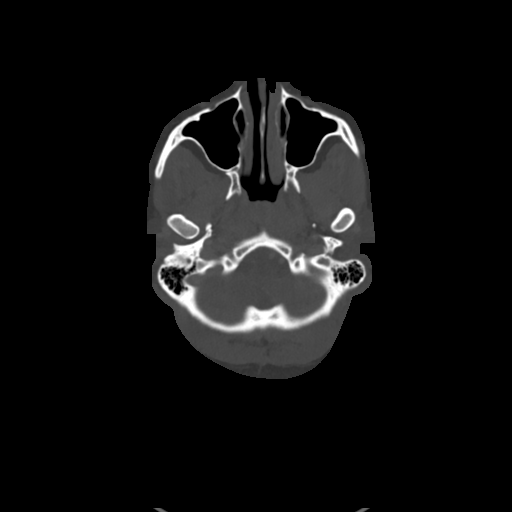

[Series 5: neck 2.0 st · sagittal · 0.48mm/px · 5 of 119 slices shown, 6 images (2 of 3)]
[im 40/119  bone]
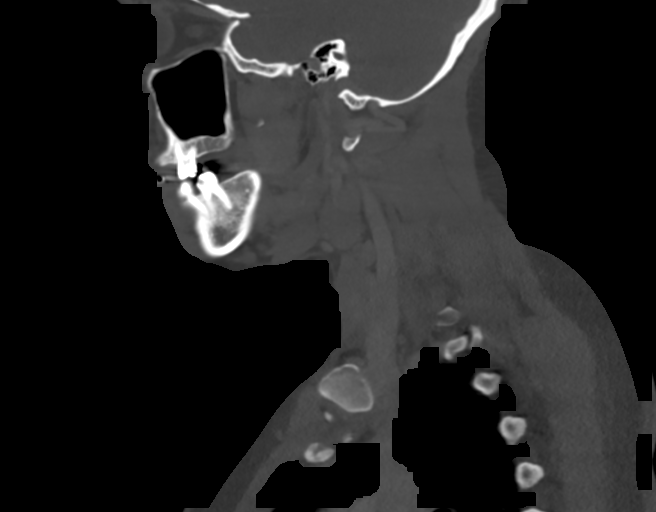
[im 50/119  bone]
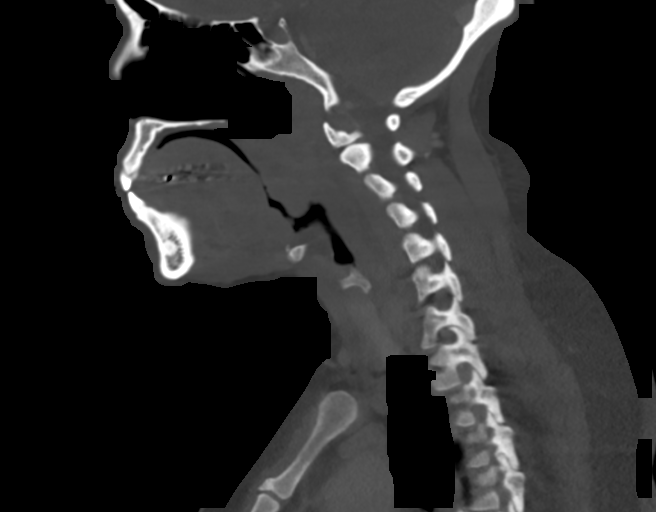
[im 60/119  soft-tissue]
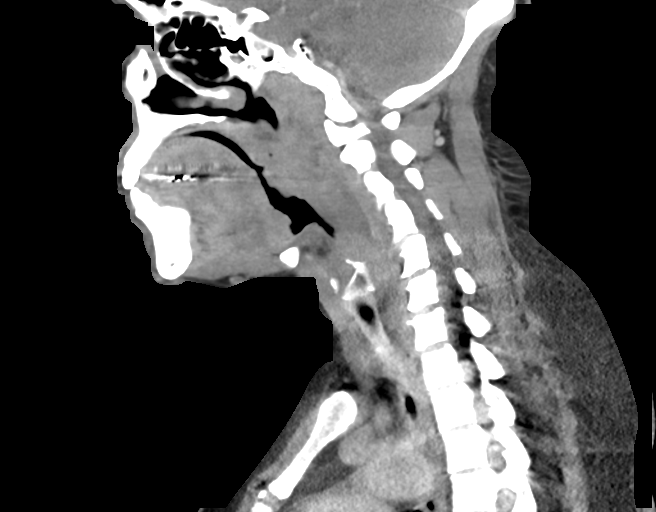
[im 60/119  bone]
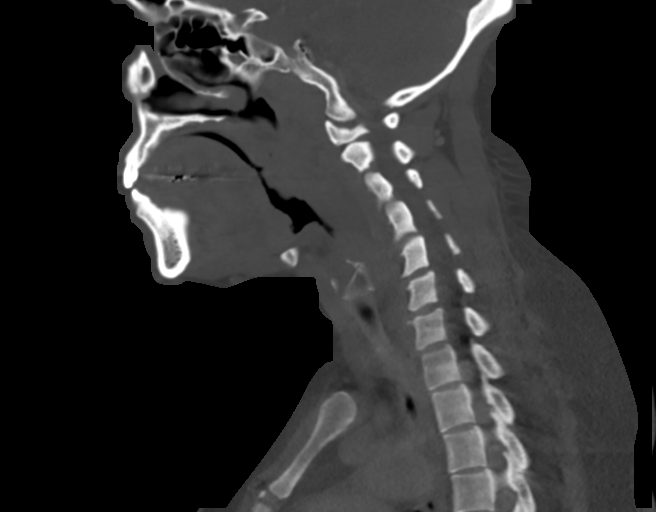
[im 69/119  bone]
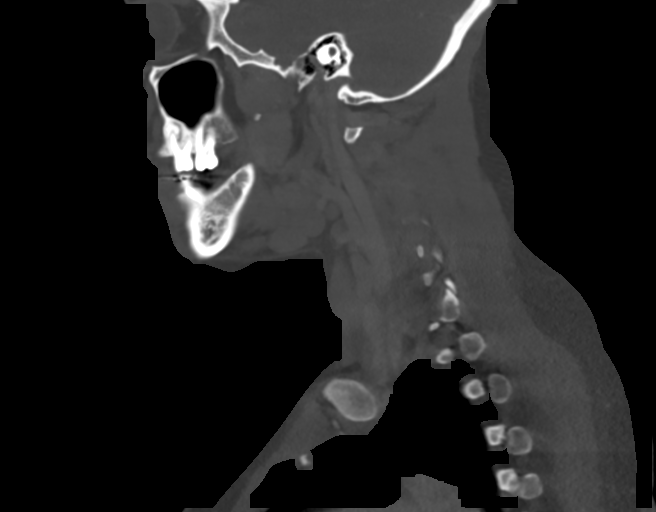
[im 79/119  bone]
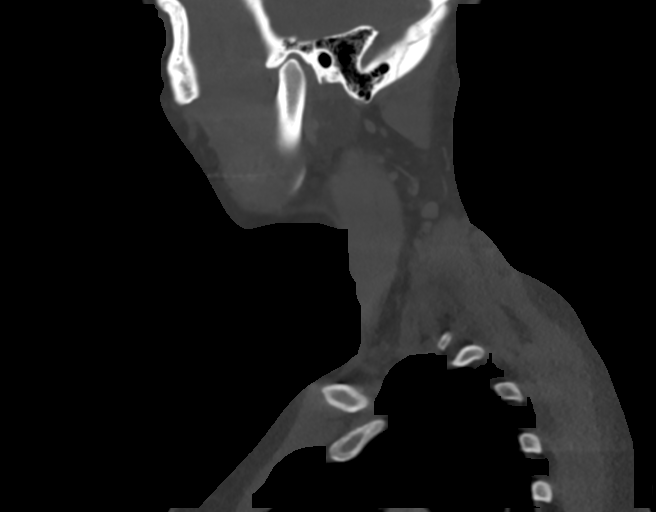

[Series 6: neck 2.0 st · coronal · 0.48mm/px · 3 of 151 slices shown (3 of 3)]
[im 31/151  bone]
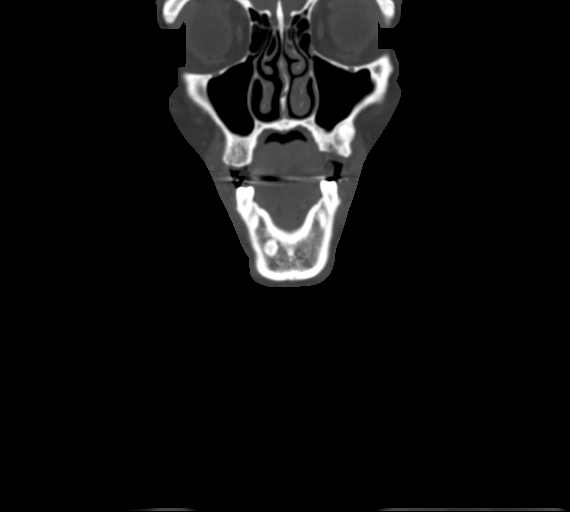
[im 61/151  bone]
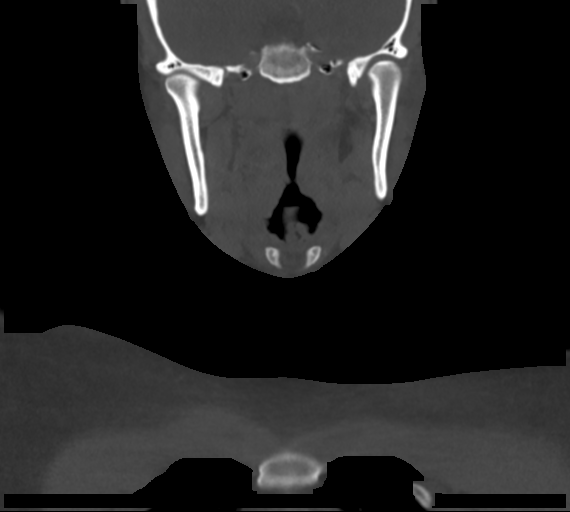
[im 91/151  bone]
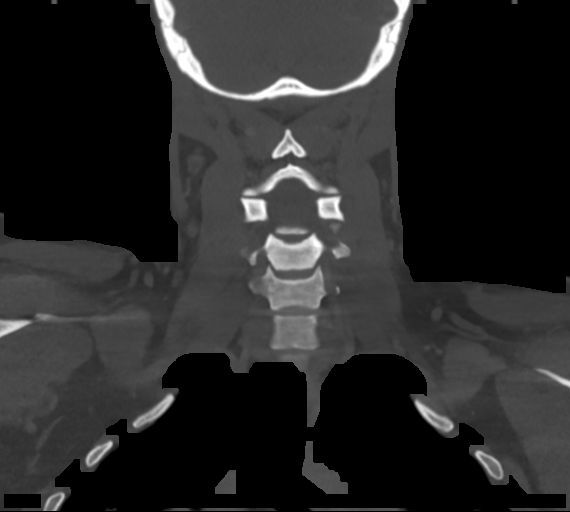

[13 of 33 positions shown; findings below may reference images not displayed]

FINDINGS: Pharynx and larynx: The tonsils are thickened in there is a rim
enhancing collection in the right peritonsillar space measuring 12 x
21 mm. Oropharyngeal submucosal edema extending into the
retropharyngeal space without rim enhancing collection.
Hypopharyngeal submucosal edema. The epiglottis and aryepiglottic
folds do not appear thickened. The airway is patent. Early
liquefaction at the base of the left tonsil.

Salivary glands: No inflammation, mass, or stone.

Thyroid: Normal.

Lymph nodes: Symmetric enlargement of jugular and lateral
retropharyngeal chain lymph nodes without cavitation.

Vascular: No major venous thrombosis.

Limited intracranial: Negative

Visualized orbits: Negative

Mastoids and visualized paranasal sinuses: Clear

Skeleton: No acute or aggressive finding.  No endplate erosions.

Upper chest: Negative for pneumonia.
IMPRESSION: 1. Tonsillitis with 12 x 21 mm right peritonsillar abscess. Early
liquefaction in the left tonsil.
2. Submucosal edema in the oropharynx and hypopharynx with
retropharyngeal effusion.
3. Reactive adenitis.

## 2021-04-07 IMAGING — US TRANSVAGINAL ULTRASOUND OF PELVIS
1 series · 13 of 25 positions shown · non-contrast
Comparison: None.

CLINICAL DATA: In the right lower abdominal pain.

EXAM:
TRANSABDOMINAL AND TRANSVAGINAL ULTRASOUND OF PELVIS
DOPPLER ULTRASOUND OF OVARIES
TECHNIQUE: Both transabdominal and transvaginal ultrasound examinations of the
pelvis were performed. Transabdominal technique was performed for
global imaging of the pelvis including uterus, ovaries, adnexal
regions, and pelvic cul-de-sac.
It was necessary to proceed with endovaginal exam following the
transabdominal exam to visualize the ovaries. Color and duplex
Doppler ultrasound was utilized to evaluate blood flow to the
ovaries.

[Series 1: transvaginal ultrasound of pelvis · 13 of 38 slices shown]
[im 1/38]
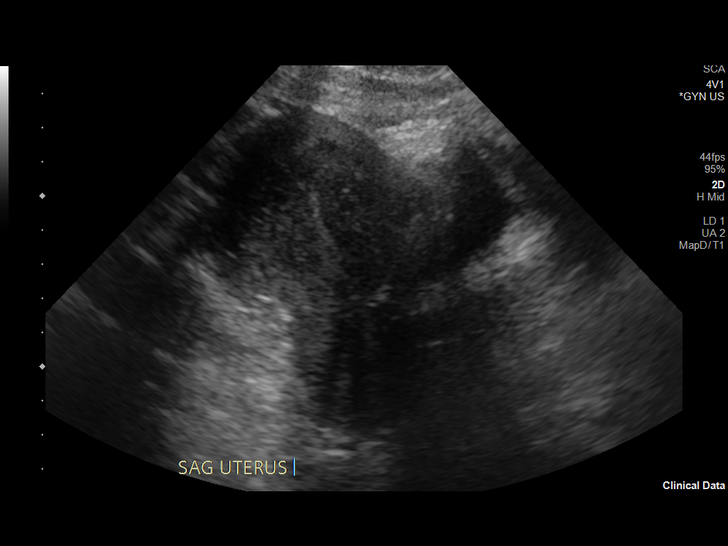
[im 4/38]
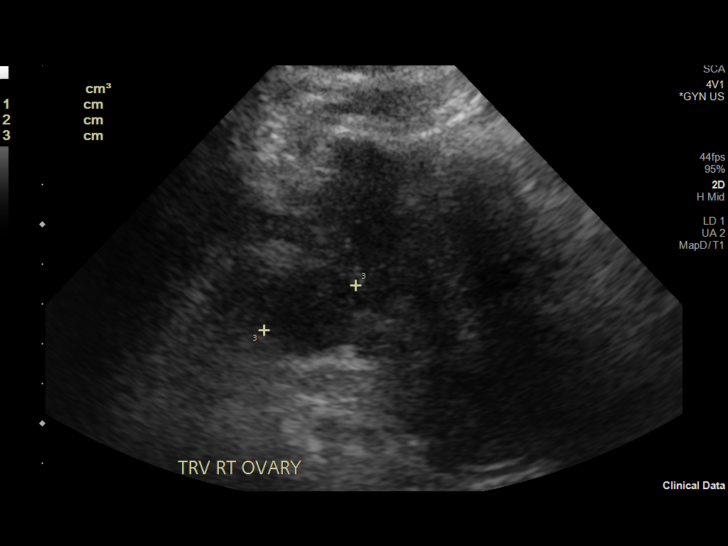
[im 7/38]
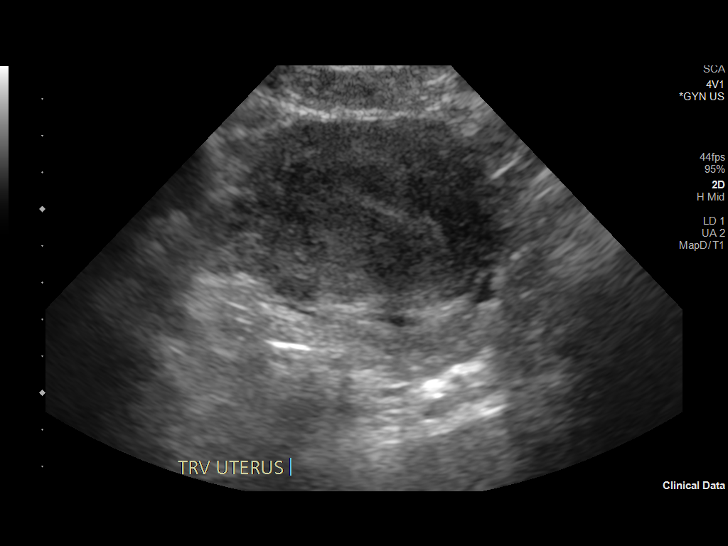
[im 10/38]
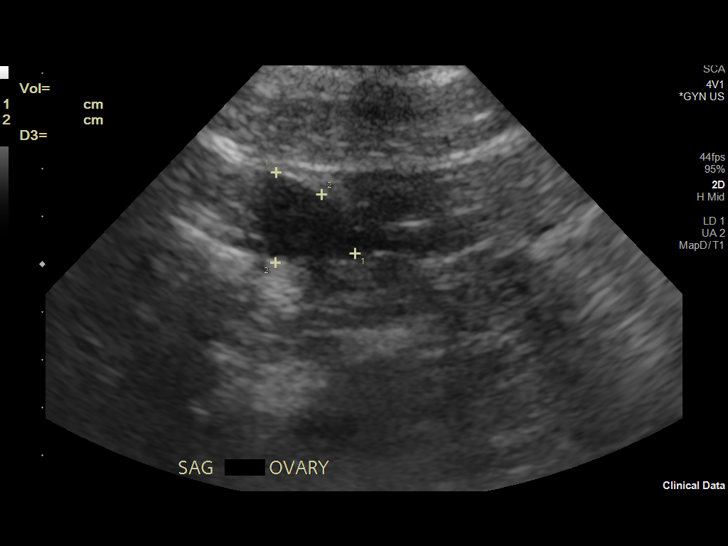
[im 13/38]
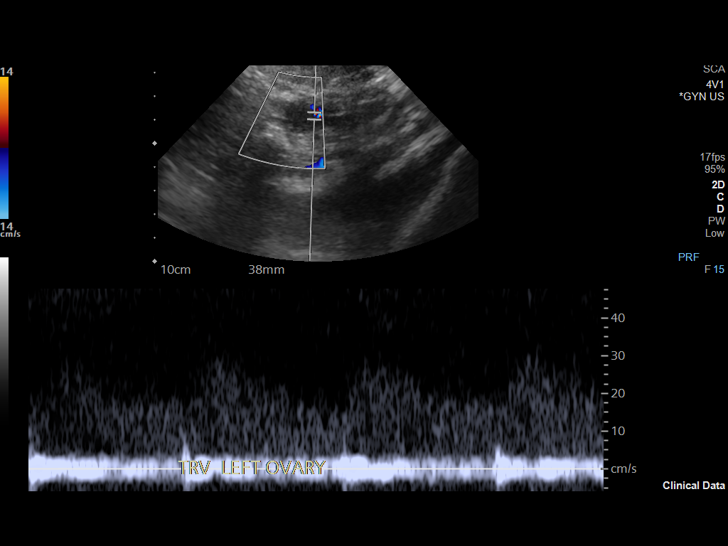
[im 16/38]
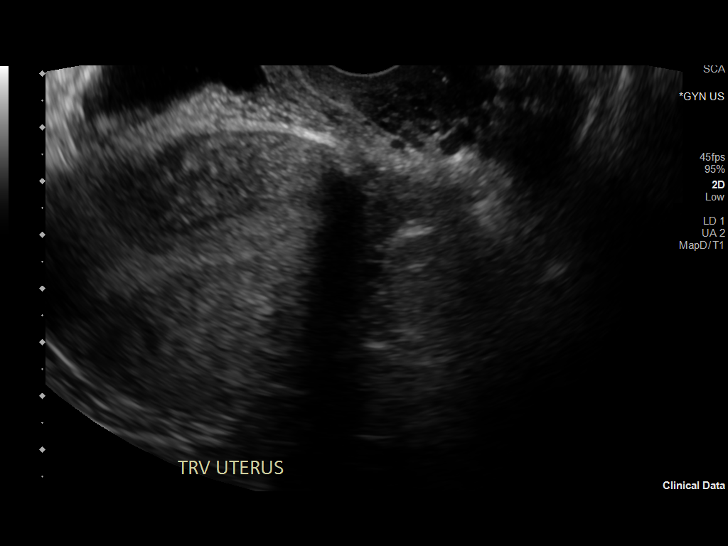
[im 19/38]
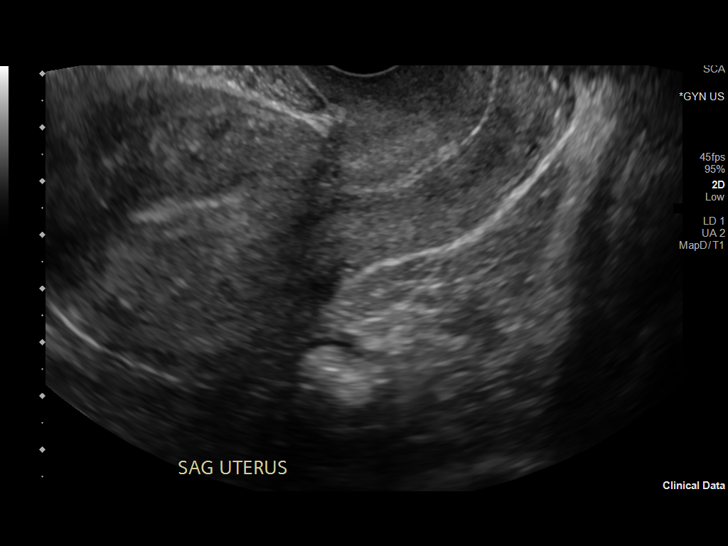
[im 22/38]
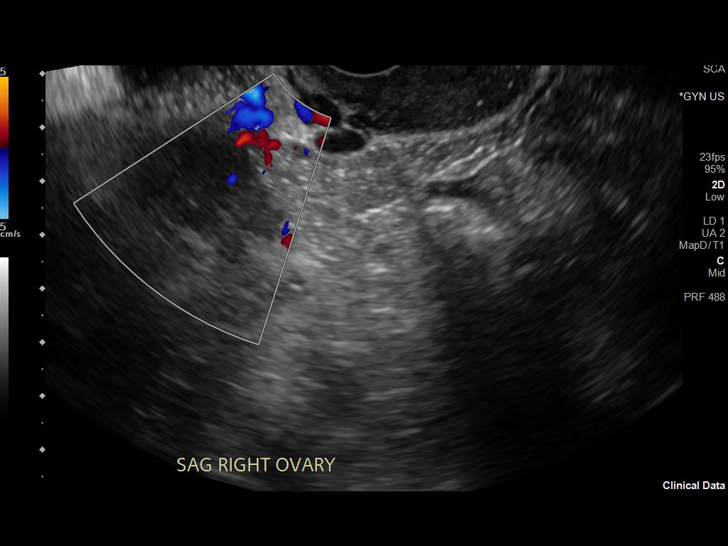
[im 25/38]
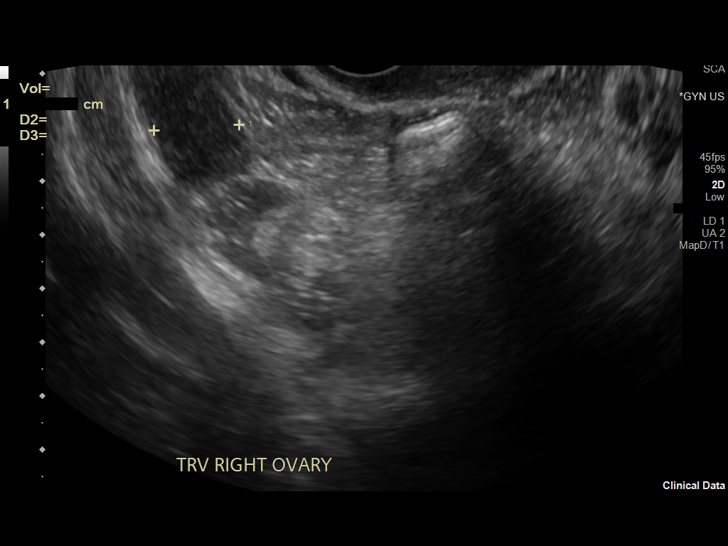
[im 28/38]
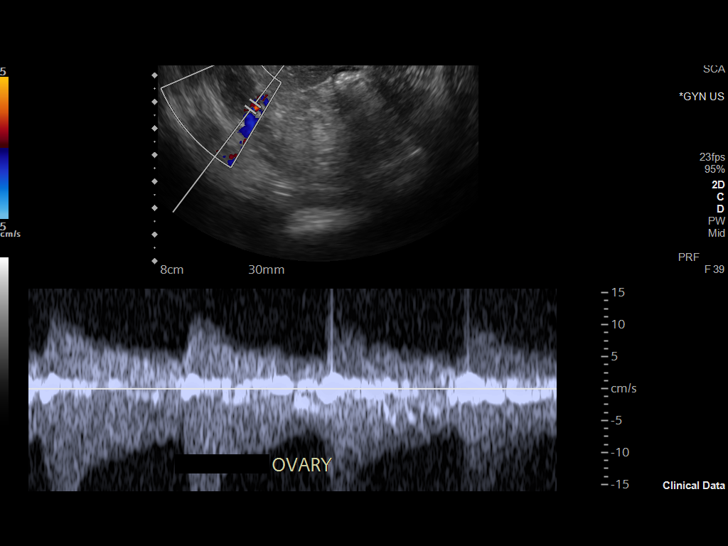
[im 31/38]
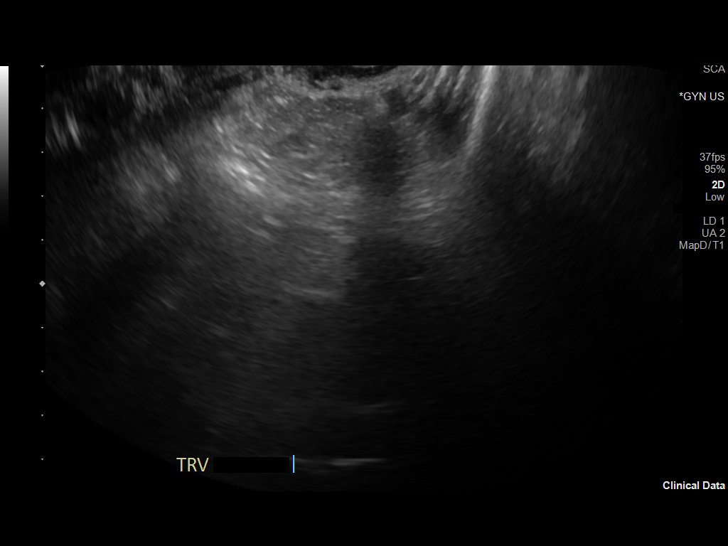
[im 34/38]
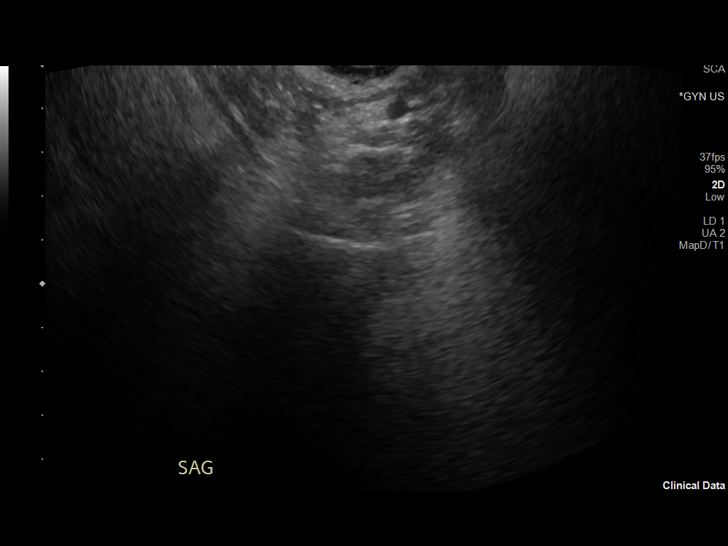
[im 38/38]
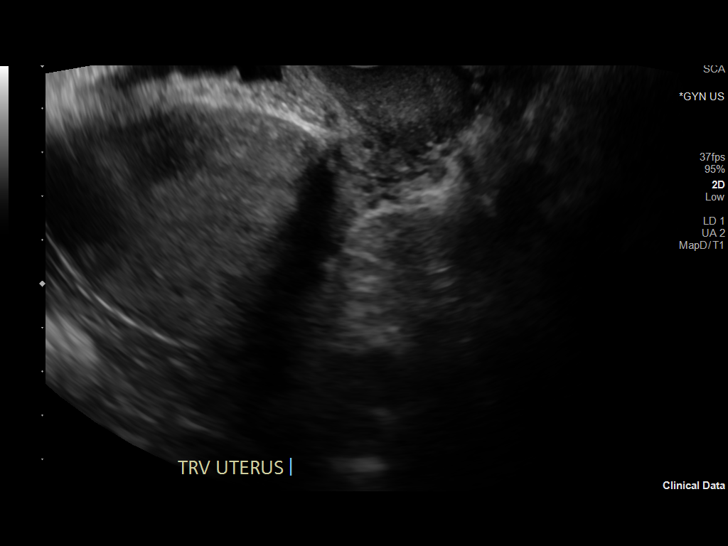

[13 of 25 positions shown; findings below may reference images not displayed]

FINDINGS: Uterus

Measurements: 9.7 x 5.4 x 6.4 cm = volume: 178 mL. No fibroids or
other mass visualized.

Endometrium

Thickness: 8.7 mm.  No focal abnormality visualized.

Right ovary

Measurements: 2.7 x 1.6 x 1.7 cm = volume: 3.8 mL. Normal
appearance/no adnexal mass.

Left ovary

Measurements: 2.4 x 1.8 x 2 cm = volume: 4.23 mL. Normal
appearance/no adnexal mass.

Pulsed Doppler evaluation of both ovaries demonstrates normal
low-resistance arterial and venous waveforms.

Other findings

No abnormal free fluid.
IMPRESSION: Normal study.  No findings to explain the patient's pain.

## 2021-04-07 IMAGING — DX CHEST - 2 VIEW
2 series · 2 of 2 positions shown · non-contrast
Comparison: None.

CLINICAL DATA: Chest pain.  Shortness of breath.

EXAM:
CHEST - 2 VIEW

[chest pa]
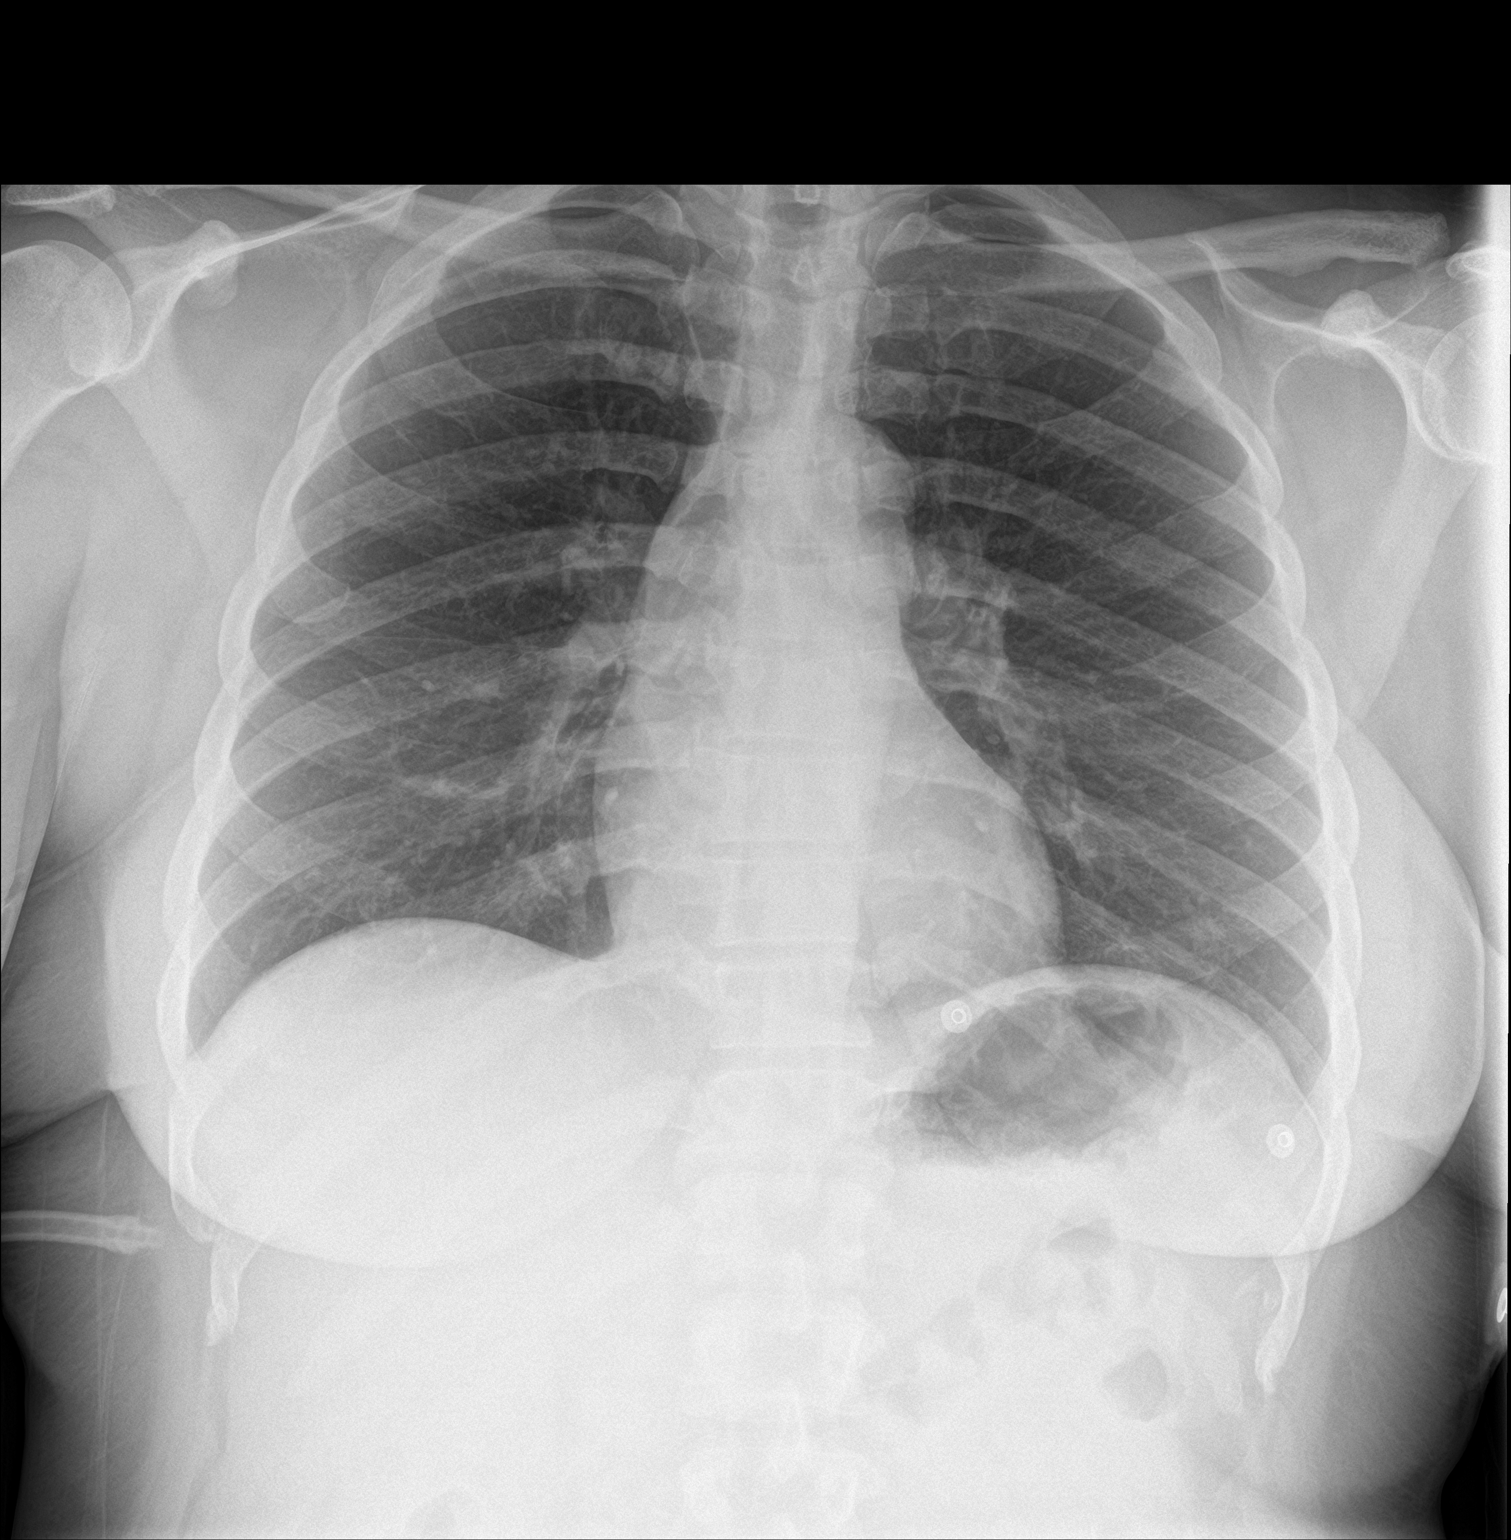

[chest lat]
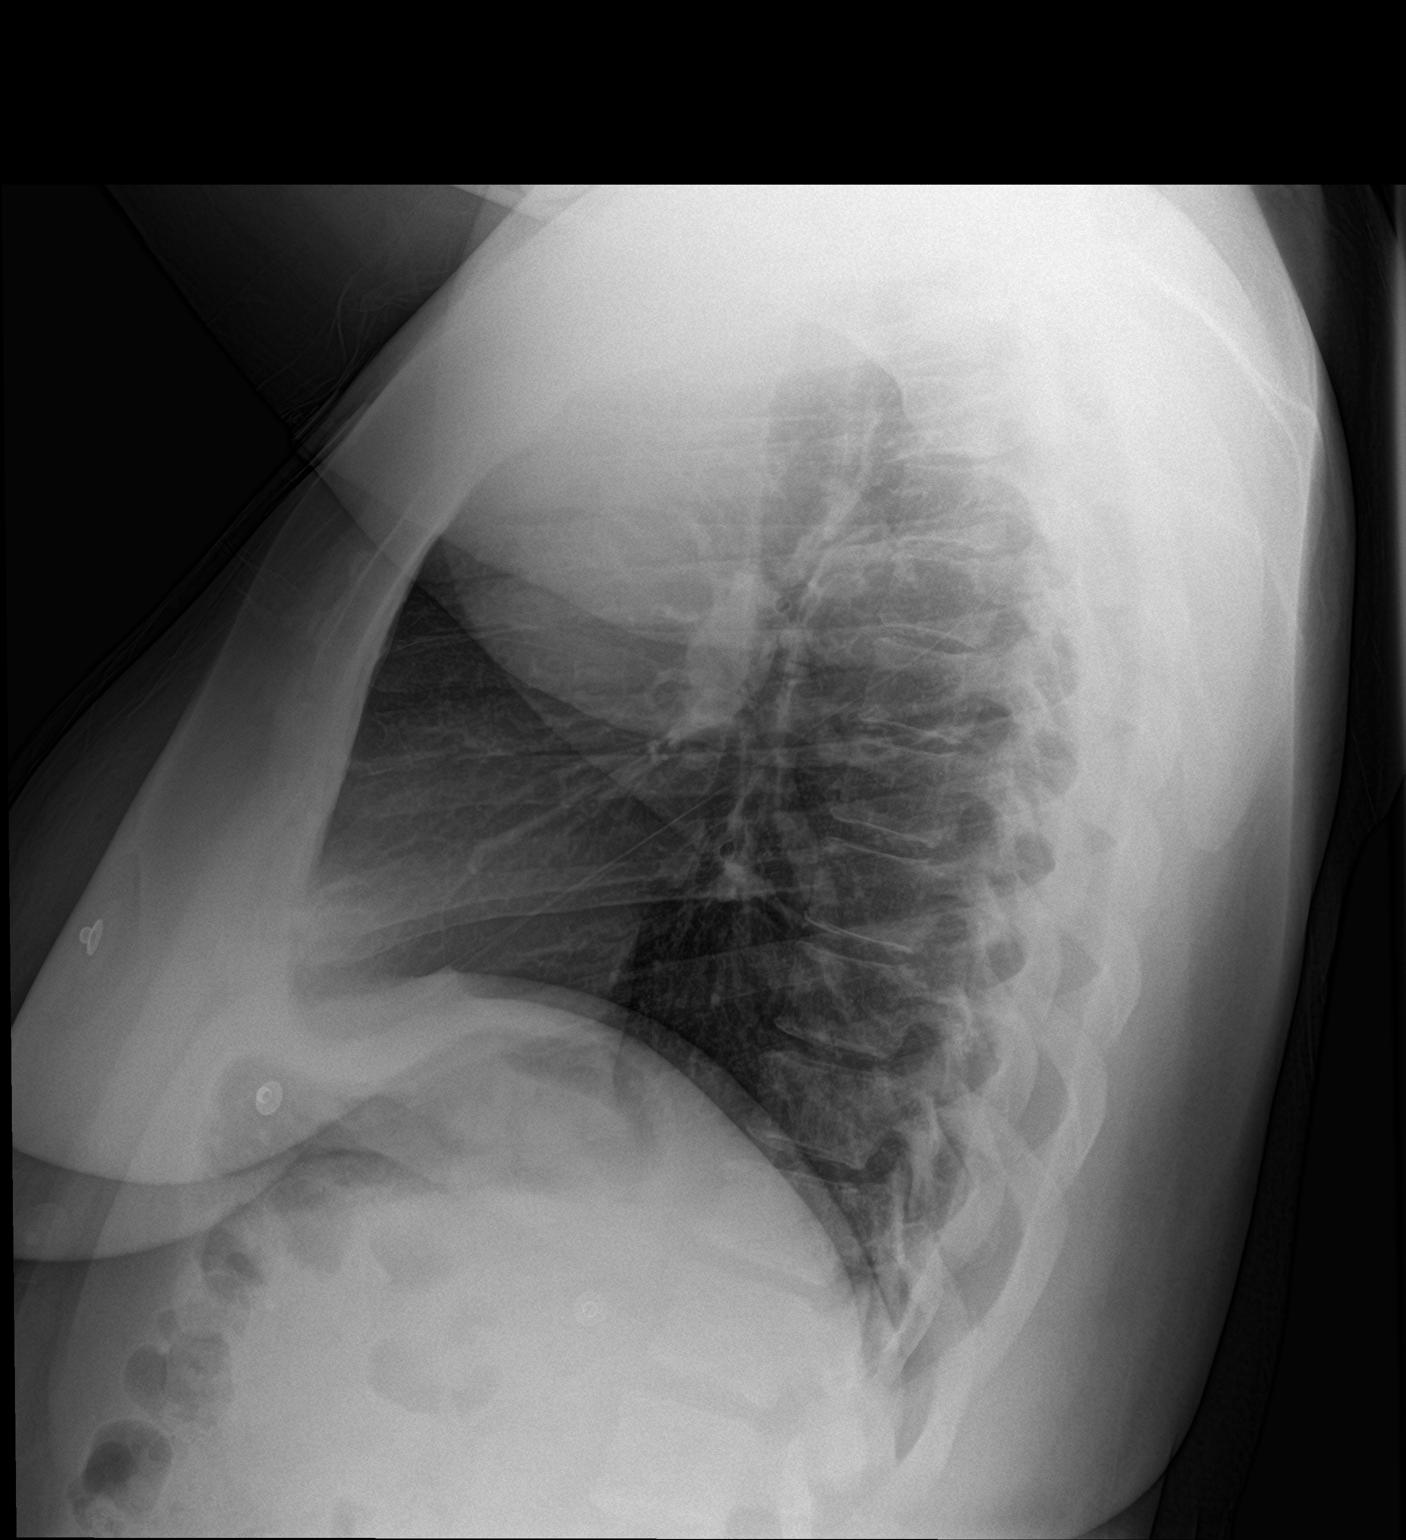

[2 of 2 positions shown; findings below may reference images not displayed]

FINDINGS: The heart size and mediastinal contours are within normal limits.
Both lungs are clear. The visualized skeletal structures are
unremarkable.
IMPRESSION: No active cardiopulmonary disease.

## 2021-04-18 ENCOUNTER — Encounter (HOSPITAL_COMMUNITY): Payer: Self-pay | Admitting: Emergency Medicine

## 2021-04-18 ENCOUNTER — Emergency Department (HOSPITAL_COMMUNITY)
Admission: EM | Admit: 2021-04-18 | Discharge: 2021-04-18 | Disposition: A | Discharge status: Home / Self Care | Payer: Self-pay | Attending: Physician Assistant | Admitting: Physician Assistant

## 2021-04-18 ENCOUNTER — Other Ambulatory Visit: Payer: Self-pay

## 2021-04-18 DIAGNOSIS — R220 Localized swelling, mass and lump, head: Secondary | ICD-10-CM

## 2021-04-18 DIAGNOSIS — K047 Periapical abscess without sinus: Secondary | ICD-10-CM | POA: Insufficient documentation

## 2021-04-18 DIAGNOSIS — F17210 Nicotine dependence, cigarettes, uncomplicated: Secondary | ICD-10-CM | POA: Insufficient documentation

## 2021-04-18 MED ORDER — CLINDAMYCIN HCL 150 MG PO CAPS: 450.0000 mg | ORAL_CAPSULE | Freq: Three times a day (TID) | ORAL | 0 refills | Status: DC

## 2021-04-18 MED ORDER — OXYCODONE-ACETAMINOPHEN 5-325 MG PO TABS
2.0000 | ORAL_TABLET | Freq: Once | ORAL | Status: AC
  Administered 2021-04-18: 2 via ORAL
  Filled 2021-04-18: qty 2

## 2021-04-18 NOTE — ED Triage Notes (Addendum)
Patient reports right lower facial swelling with pain onset 3 days ago , no fever or chills , patient added dental caries at right lower molar.

## 2021-04-18 NOTE — Discharge Instructions (Addendum)
1. Medications: Clindamycin, alternate tylenol and ibuprofen for pain control, usual home medications 2. Treatment: rest, drink plenty of fluids, take medications as prescribed 3. Follow Up: Please followup with dentistry within 1 week for discussion of your diagnoses and further evaluation after today's visit; if you do not have a primary care doctor use the resource guide provided to find one; Return to the ER for high fevers, difficulty breathing, difficulty swallowing or other concerning symptoms

## 2021-04-18 NOTE — ED Provider Notes (Signed)
[facility] EMERGENCY DEPARTMENT Provider Note   CSN: [id number] Arrival date & time: 04/18/21  2147     History Chief Complaint  Patient presents with   Facial Swelling    Megan [name] is a 40 y.o. female presents to the emergency department with 3 days of right lower dental pain and facial swelling.  Reports things worsened last night and today.  She is tried Orajel and swishing with warm salt water without relief.  Reports swelling and pain in her face but no changes in voice or difficulty swallowing.  Reports subjective fevers last night but no measured fever today here in the emergency department.  Eating and drinking make the symptoms worse.  Nothing seems to make them better.  Patient has not seen a dentist in more than a year  The history is provided by the patient and medical records. No language interpreter was used.      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   Cesarean delivery delivered 08/24/2013   Maternal anemia complicating pregnancy, childbirth, or the puerperium 08/24/2013   Asymptomatic bacteriuria in pregnancy 08/24/2013   Normal delivery at term 08/23/2013   Fetal heart rate decelerations affecting management of mother 08/23/2013   Gestational hypertension 08/19/2013   Obesity complicating pregnancy in third trimester 08/19/2013   Grand multipara 08/19/2013    Past Surgical History:  Procedure Laterality Date   CESAREAN SECTION WITH BILATERAL TUBAL LIGATION N/A 08/23/2013   Procedure: Primary Cesarean Section Delivery Baby Girl @ 2347, Apgars 8/9, Bilateral Tubal Ligation;  Surgeon: Antionette Char,  MD;  Location: [facility];  Service: Obstetrics;  Laterality: N/A;   FINGER NAIL SURGERY     KNEE SURGERY     WISDOM TOOTH EXTRACTION       OB History     Gravida  5   Para  5   Term  4   Preterm  1   AB      Living  5      SAB      IAB      Ectopic      Multiple      Live Births  5           Family History  Problem Relation Age of Onset   Hypertension Mother    Diabetes Mother    Heart disease Mother    Sickle cell trait Mother     Social History   Tobacco Use   Smoking status: Some Days    Types: Cigarettes    Last attempt to quit: 01/01/2013    Years since quitting: 8.2   Smokeless tobacco: Never  Substance Use Topics   Alcohol use: No   Drug use: Yes    Frequency: 7.0 times per week    Types: Marijuana    Comment: 1 gram per day    Home Medications Prior to Admission medications   Medication Sig Start Date End Date Taking? Authorizing Provider  clindamycin (CLEOCIN) 150 MG capsule Take 3 capsules (450 mg total) by mouth 3 (three) times daily. 04/18/21  Yes Blayne Frankie, Dahlia Client,  PA-C  dexamethasone (DECADRON) 6 MG tablet Take 1.5 tablets (9 mg total) by mouth daily. Patient not taking: Reported on 12/16/2019 04/28/18   Mesner, Barbara Cower, MD  hydrOXYzine (ATARAX/VISTARIL) 25 MG tablet Take 1 tablet (25 mg total) by mouth every 6 (six) hours as needed for anxiety. Patient not taking: Reported on 12/16/2019 02/12/19   Dartha Lodge, PA-C    Allergies    Patient has no known allergies.  Review of Systems   Review of Systems  Constitutional:  Positive for fever (Subjective). Negative for appetite change and chills.  HENT:  Positive for dental problem and facial swelling. Negative for drooling, ear pain, nosebleeds, postnasal drip, rhinorrhea and trouble swallowing.   Eyes:  Negative for pain and redness.  Respiratory:  Negative for cough and wheezing.   Cardiovascular:  Negative for chest pain.  Gastrointestinal:  Negative for abdominal pain, nausea and  vomiting.  Musculoskeletal:  Negative for neck pain and neck stiffness.  Skin:  Negative for color change and rash.  Neurological:  Positive for headaches. Negative for weakness and light-headedness.  All other systems reviewed and are negative.  Physical Exam Updated Vital Signs BP (!) 149/96 (BP Location: Left Arm)   Pulse 82   Temp 98.9 F (37.2 C)   Resp 18   Ht 5\' 3"  (1.6 m)   Wt 125 kg   LMP 04/04/2021   SpO2 99%   BMI 48.82 kg/m   Physical Exam Vitals and nursing note reviewed.  Constitutional:      General: She is not in acute distress.    Appearance: She is well-developed. She is not ill-appearing.  HENT:     Head: Normocephalic.      Comments: No induration of the sublingual tissue.  Handling secretions without difficulty.  Normal phonation.    Mouth/Throat:      Comments: No gross abscess Eyes:     General: No scleral icterus.    Conjunctiva/sclera: Conjunctivae normal.  Cardiovascular:     Rate and Rhythm: Normal rate.  Pulmonary:     Effort: Pulmonary effort is normal.  Abdominal:     General: There is no distension.  Musculoskeletal:        General: Normal range of motion.     Cervical back: Normal range of motion.  Skin:    General: Skin is warm and dry.  Neurological:     Mental Status: She is alert.  Psychiatric:        Mood and Affect: Mood normal.    ED Results / Procedures / Treatments   Labs (all labs ordered are listed, but only abnormal results are displayed) Labs Reviewed - No data to display  EKG None  Radiology No results found.  Procedures Procedures   Medications Ordered in ED Medications  oxyCODONE-acetaminophen (PERCOCET/ROXICET) 5-325 MG per tablet 2 tablet (has no administration in time range)    ED Course  I have reviewed the triage vital signs and the nursing notes.  Pertinent labs & imaging results that were available during my care of the patient were reviewed by me and considered in my medical decision  making (see chart for details).    MDM Rules/Calculators/A&P                           Patient with toothache.  No gross abscess.  Exam unconcerning for Ludwig's angina or spread of infection.  Will treat with clindamycin and anti-inflammatories medicine.  Urged patient to  follow-up with dentist.    Final Clinical Impression(s) / ED Diagnoses Final diagnoses:  Facial swelling  Dental infection    Rx / DC Orders ED Discharge Orders          Ordered    clindamycin (CLEOCIN) 150 MG capsule  3 times daily        04/18/21 2242             Verleen Stuckey, Boyd Kerbs 04/18/21 2246    Gerhard Munch, MD 04/18/21 [id number]

## 2023-12-24 ENCOUNTER — Encounter (HOSPITAL_BASED_OUTPATIENT_CLINIC_OR_DEPARTMENT_OTHER): Payer: Self-pay | Admitting: Emergency Medicine

## 2023-12-24 ENCOUNTER — Emergency Department (HOSPITAL_BASED_OUTPATIENT_CLINIC_OR_DEPARTMENT_OTHER)
Admission: EM | Admit: 2023-12-24 | Discharge: 2023-12-24 | Disposition: A | Discharge status: Home / Self Care | Attending: Emergency Medicine | Admitting: Emergency Medicine

## 2023-12-24 ENCOUNTER — Other Ambulatory Visit: Payer: Self-pay

## 2023-12-24 DIAGNOSIS — N3001 Acute cystitis with hematuria: Secondary | ICD-10-CM | POA: Insufficient documentation

## 2023-12-24 DIAGNOSIS — F17210 Nicotine dependence, cigarettes, uncomplicated: Secondary | ICD-10-CM | POA: Diagnosis not present

## 2023-12-24 DIAGNOSIS — B3731 Acute candidiasis of vulva and vagina: Secondary | ICD-10-CM | POA: Diagnosis not present

## 2023-12-24 DIAGNOSIS — L292 Pruritus vulvae: Secondary | ICD-10-CM | POA: Diagnosis present

## 2023-12-24 LAB — URINALYSIS, ROUTINE W REFLEX MICROSCOPIC
Bilirubin Urine: NEGATIVE
Glucose, UA: >1000 mg/dL — AB
Ketones, ur: 15 mg/dL — AB
Nitrite: NEGATIVE
Protein, ur: 100 mg/dL — AB
RBC / HPF: >50 RBC/hpf (ref 0–5)
Specific Gravity, Urine: 1.023 (ref 1.005–1.030)
WBC, UA: >50 WBC/hpf (ref 0–5)
pH: 6 (ref 5.0–8.0)

## 2023-12-24 LAB — PREGNANCY, URINE: Preg Test, Ur: NEGATIVE

## 2023-12-24 LAB — GC/CHLAMYDIA PROBE AMP (~~LOC~~) NOT AT ARMC
Chlamydia: NEGATIVE
Comment: NEGATIVE
Comment: NORMAL
Neisseria Gonorrhea: NEGATIVE

## 2023-12-24 LAB — WET PREP, GENITAL
Clue Cells Wet Prep HPF POC: NONE SEEN
Sperm: NONE SEEN
Trich, Wet Prep: NONE SEEN
WBC, Wet Prep HPF POC: <10 (ref <10)
Yeast Wet Prep HPF POC: POSITIVE — AB

## 2023-12-24 MED ORDER — CEPHALEXIN 500 MG PO CAPS: 500.0000 mg | ORAL_CAPSULE | Freq: Three times a day (TID) | ORAL | 0 refills | Status: AC

## 2023-12-24 MED ORDER — CEPHALEXIN 250 MG PO CAPS
500.0000 mg | ORAL_CAPSULE | Freq: Once | ORAL | Status: AC
  Administered 2023-12-24: 500 mg via ORAL
  Filled 2023-12-24: qty 2

## 2023-12-24 MED ORDER — NAPROXEN 250 MG PO TABS
500.0000 mg | ORAL_TABLET | Freq: Once | ORAL | Status: AC
  Administered 2023-12-24: 500 mg via ORAL
  Filled 2023-12-24: qty 2

## 2023-12-24 MED ORDER — FLUCONAZOLE 150 MG PO TABS
150.0000 mg | ORAL_TABLET | Freq: Once | ORAL | Status: AC
  Administered 2023-12-24: 150 mg via ORAL
  Filled 2023-12-24: qty 1

## 2023-12-24 NOTE — Discharge Instructions (Signed)
 You were evaluated in the Emergency Department and after careful evaluation, we did not find any emergent condition requiring admission or further testing in the hospital.  Your exam/testing today was overall reassuring.  Symptoms seem to be due to a bladder infection as well as yeast infection.  Take the Keflex  antibiotic as directed at home.  Use Tylenol  or Motrin  for discomfort.  Please return to the Emergency Department if you experience any worsening of your condition.  Thank you for allowing us  to be a part of your care.

## 2023-12-24 NOTE — ED Notes (Signed)
Pt requesting STD testing

## 2023-12-24 NOTE — ED Provider Notes (Signed)
 [facility] Emergency Department Provider Note MRN:  [id number]  Arrival date & time: 12/24/23     Chief Complaint   Vaginal Itching   History of Present Illness   Megan [name] is a 43 y.o. year-old female with no pertinent past medical history presenting to the ED with chief complaint of vaginal itching.  Vaginal itching and irritation, pain with urination.  Symptoms for the past 2 or 3 days.  No fever, no other complaints.  Review of Systems  A thorough review of systems was obtained and all systems are negative except as noted in the HPI and PMH.   Patient's Health History    Past Medical History:  Diagnosis Date   Anemia    Obese    Sickle cell trait (HCC)    Thyroid disease     Past Surgical History:  Procedure Laterality Date   CESAREAN SECTION WITH BILATERAL TUBAL LIGATION N/A 08/23/2013   Procedure: Primary Cesarean Section Delivery Baby Girl @ 2347, Apgars 8/9, Bilateral Tubal Ligation;  Surgeon: Abdul Hodgkin, MD;  Location: [facility];  Service: Obstetrics;  Laterality: N/A;   FINGER NAIL SURGERY     KNEE SURGERY     WISDOM TOOTH EXTRACTION      Family History  Problem Relation Age of Onset   Hypertension Mother    Diabetes Mother    Heart disease Mother    Sickle cell trait Mother     Social History   Socioeconomic History   Marital status: Single    Spouse name: Not on file   Number of children: Not on file   Years of education: Not on file   Highest education level: Not on file  Occupational History   Not on file  Tobacco Use   Smoking status: Some Days    Current packs/day: 0.00    Types: Cigarettes    Last attempt to quit: 01/01/2013    Years since quitting: 10.9   Smokeless tobacco: Never  Substance and Sexual Activity   Alcohol use: No   Drug use: Yes    Frequency: 7.0 times per week    Types: Marijuana    Comment: 1 gram per day   Sexual activity: Yes    Birth control/protection: None  Other Topics  Concern   Not on file  Social History Narrative   Not on file   Social Drivers of Health   Financial Resource Strain: Not on file  Food Insecurity: Not on file  Transportation Needs: Not on file  Physical Activity: Not on file  Stress: Not on file  Social Connections: Not on file  Intimate Partner Violence: Not on file     Physical Exam   Vitals:   12/24/23 0026  BP: (!) 150/87  Pulse: (!) 109  Resp: 15  Temp: 97.7 F (36.5 C)  SpO2: 99%    CONSTITUTIONAL: Well-appearing, NAD NEURO/PSYCH:  Alert and oriented x 3, no focal deficits EYES:  eyes equal and reactive ENT/NECK:  no LAD, no JVD CARDIO: Regular rate, well-perfused, normal S1 and S2 PULM:  CTAB no wheezing or rhonchi GI/GU:  non-distended, non-tender MSK/SPINE:  No gross deformities, no edema SKIN:  no rash, atraumatic   *Additional and/or pertinent findings included in MDM below  Diagnostic and Interventional Summary    EKG Interpretation Date/Time:    Ventricular Rate:    PR Interval:    QRS Duration:    QT Interval:    QTC Calculation:   R Axis:  Text Interpretation:         Labs Reviewed  WET PREP, GENITAL - Abnormal; Notable for the following components:      Result Value   Yeast Wet Prep HPF POC POSITIVE (*)    All other components within normal limits  URINALYSIS, ROUTINE W REFLEX MICROSCOPIC - Abnormal; Notable for the following components:   Color, Urine ORANGE (*)    APPearance CLOUDY (*)    Glucose, UA >1,000 (*)    Hgb urine dipstick LARGE (*)    Ketones, ur 15 (*)    Protein, ur 100 (*)    Leukocytes,Ua LARGE (*)    Bacteria, UA MANY (*)    All other components within normal limits  PREGNANCY, URINE  GC/CHLAMYDIA PROBE AMP ([place]) NOT AT [facility]    No orders to display    Medications  fluconazole  (DIFLUCAN ) tablet 150 mg (has no administration in time range)  cephALEXin  (KEFLEX ) capsule 500 mg (has no administration in time range)  naproxen (NAPROSYN) tablet 500  mg (has no administration in time range)     Procedures  /  Critical Care Procedures  ED Course and Medical Decision Making  Initial Impression and Ddx Well-appearing in no acute distress, abdomen soft and nontender.  Patient denies any genital lesions or rash.  Suspect STD versus yeast infection versus BV versus UTI.  Doubt PID  Past medical/surgical history that increases complexity of ED encounter: None  Interpretation of Diagnostics I personally reviewed the Laboratory Testing and my interpretation is as follows: Possible UTI, positive for yeast as well    Patient Reassessment and Ultimate Disposition/Management     Discharge  Patient management required discussion with the following services or consulting groups:  None  Complexity of Problems Addressed Acute complicated illness or Injury  Additional Data Reviewed and Analyzed Further history obtained from: None  Additional Factors Impacting ED Encounter Risk Prescriptions  Merrick Abe. Harless Lien, MD [facility] Emergency Medicine [facility] [email or web address]  Final Clinical Impressions(s) / ED Diagnoses     ICD-10-CM   1. Acute cystitis with hematuria  N30.01     2. Vaginal yeast infection  B37.31       ED Discharge Orders          Ordered    cephALEXin  (KEFLEX ) 500 MG capsule  3 times daily        12/24/23 0400             Discharge Instructions Discussed with and Provided to Patient:    Discharge Instructions      You were evaluated in the Emergency Department and after careful evaluation, we did not find any emergent condition requiring admission or further testing in the hospital.  Your exam/testing today was overall reassuring.  Symptoms seem to be due to a bladder infection as well as yeast infection.  Take the Keflex  antibiotic as directed at home.  Use Tylenol  or Motrin  for discomfort.  Please return to the Emergency Department if you experience any worsening of your  condition.  Thank you for allowing us  to be a part of your care.       Edson Graces, MD 12/24/23 [phone]

## 2023-12-24 NOTE — ED Triage Notes (Signed)
 Pt states that she has vaginal itching and burning with urination x 1 day. Denies discharge.

## 2024-01-04 ENCOUNTER — Encounter (HOSPITAL_COMMUNITY): Payer: Self-pay

## 2024-01-04 ENCOUNTER — Inpatient Hospital Stay (HOSPITAL_COMMUNITY)
Admission: EM | Admit: 2024-01-04 | Discharge: 2024-01-08 | DRG: 638 | Disposition: A | Discharge status: Home / Self Care | Attending: Internal Medicine | Admitting: Internal Medicine

## 2024-01-04 ENCOUNTER — Encounter (HOSPITAL_COMMUNITY): Payer: Self-pay | Admitting: *Deleted

## 2024-01-04 ENCOUNTER — Other Ambulatory Visit: Payer: Self-pay

## 2024-01-04 ENCOUNTER — Ambulatory Visit (HOSPITAL_COMMUNITY): Admission: EM | Admit: 2024-01-04 | Discharge: 2024-01-04 | Disposition: A | Discharge status: Home / Self Care

## 2024-01-04 DIAGNOSIS — Z833 Family history of diabetes mellitus: Secondary | ICD-10-CM

## 2024-01-04 DIAGNOSIS — E1100 Type 2 diabetes mellitus with hyperosmolarity without nonketotic hyperglycemic-hyperosmolar coma (NKHHC): Principal | ICD-10-CM | POA: Diagnosis present

## 2024-01-04 DIAGNOSIS — E869 Volume depletion, unspecified: Secondary | ICD-10-CM | POA: Diagnosis present

## 2024-01-04 DIAGNOSIS — J45909 Unspecified asthma, uncomplicated: Secondary | ICD-10-CM | POA: Diagnosis present

## 2024-01-04 DIAGNOSIS — E876 Hypokalemia: Secondary | ICD-10-CM | POA: Diagnosis present

## 2024-01-04 DIAGNOSIS — K5900 Constipation, unspecified: Secondary | ICD-10-CM | POA: Diagnosis present

## 2024-01-04 DIAGNOSIS — N179 Acute kidney failure, unspecified: Secondary | ICD-10-CM

## 2024-01-04 DIAGNOSIS — Z8249 Family history of ischemic heart disease and other diseases of the circulatory system: Secondary | ICD-10-CM

## 2024-01-04 DIAGNOSIS — R030 Elevated blood-pressure reading, without diagnosis of hypertension: Secondary | ICD-10-CM

## 2024-01-04 DIAGNOSIS — R531 Weakness: Secondary | ICD-10-CM

## 2024-01-04 DIAGNOSIS — E870 Hyperosmolality and hypernatremia: Secondary | ICD-10-CM | POA: Diagnosis not present

## 2024-01-04 DIAGNOSIS — R32 Unspecified urinary incontinence: Secondary | ICD-10-CM

## 2024-01-04 DIAGNOSIS — Z87440 Personal history of urinary (tract) infections: Secondary | ICD-10-CM

## 2024-01-04 DIAGNOSIS — E11649 Type 2 diabetes mellitus with hypoglycemia without coma: Secondary | ICD-10-CM | POA: Diagnosis present

## 2024-01-04 DIAGNOSIS — R112 Nausea with vomiting, unspecified: Secondary | ICD-10-CM | POA: Diagnosis not present

## 2024-01-04 DIAGNOSIS — D573 Sickle-cell trait: Secondary | ICD-10-CM | POA: Diagnosis present

## 2024-01-04 DIAGNOSIS — D72828 Other elevated white blood cell count: Secondary | ICD-10-CM | POA: Diagnosis present

## 2024-01-04 DIAGNOSIS — Z6841 Body Mass Index (BMI) 40.0 and over, adult: Secondary | ICD-10-CM

## 2024-01-04 DIAGNOSIS — Z832 Family history of diseases of the blood and blood-forming organs and certain disorders involving the immune mechanism: Secondary | ICD-10-CM

## 2024-01-04 LAB — I-STAT VENOUS BLOOD GAS, ED
Acid-Base Excess: 0 mmol/L (ref 0.0–2.0)
Bicarbonate: 25 mmol/L (ref 20.0–28.0)
Calcium, Ion: 1.19 mmol/L (ref 1.15–1.40)
HCT: 43 % (ref 36.0–46.0)
Hemoglobin: 14.6 g/dL (ref 12.0–15.0)
O2 Saturation: 75 %
Potassium: 4 mmol/L (ref 3.5–5.1)
Sodium: 136 mmol/L (ref 135–145)
TCO2: 26 mmol/L (ref 22–32)
pCO2, Ven: 41.9 mmHg — ABNORMAL LOW (ref 44–60)
pH, Ven: 7.383 (ref 7.25–7.43)
pO2, Ven: 41 mmHg (ref 32–45)

## 2024-01-04 LAB — COMPREHENSIVE METABOLIC PANEL WITH GFR
ALT: 41 U/L (ref 0–44)
AST: 24 U/L (ref 15–41)
Albumin: 4.1 g/dL (ref 3.5–5.0)
Alkaline Phosphatase: 129 U/L — ABNORMAL HIGH (ref 38–126)
Anion gap: 23 — ABNORMAL HIGH (ref 5–15)
BUN: 15 mg/dL (ref 6–20)
CO2: 22 mmol/L (ref 22–32)
Calcium: 10.6 mg/dL — ABNORMAL HIGH (ref 8.9–10.3)
Chloride: 94 mmol/L — ABNORMAL LOW (ref 98–111)
Creatinine, Ser: 1.58 mg/dL — ABNORMAL HIGH (ref 0.44–1.00)
GFR, Estimated: 41 mL/min — ABNORMAL LOW (ref >60)
Glucose, Bld: 974 mg/dL (ref 70–99)
Potassium: 3.7 mmol/L (ref 3.5–5.1)
Sodium: 139 mmol/L (ref 135–145)
Total Bilirubin: 1.2 mg/dL (ref 0.0–1.2)
Total Protein: 9 g/dL — ABNORMAL HIGH (ref 6.5–8.1)

## 2024-01-04 LAB — URINALYSIS, ROUTINE W REFLEX MICROSCOPIC
Bilirubin Urine: NEGATIVE
Glucose, UA: >=500 mg/dL — AB
Hgb urine dipstick: NEGATIVE
Ketones, ur: 5 mg/dL — AB
Leukocytes,Ua: NEGATIVE
Nitrite: NEGATIVE
Protein, ur: NEGATIVE mg/dL
Specific Gravity, Urine: 1.026 (ref 1.005–1.030)
pH: 5 (ref 5.0–8.0)

## 2024-01-04 LAB — OSMOLALITY: Osmolality: 367 mosm/kg (ref 275–295)

## 2024-01-04 LAB — CBC
HCT: 42.6 % (ref 36.0–46.0)
Hemoglobin: 13.6 g/dL (ref 12.0–15.0)
MCH: 22.3 pg — ABNORMAL LOW (ref 26.0–34.0)
MCHC: 31.9 g/dL (ref 30.0–36.0)
MCV: 69.8 fL — ABNORMAL LOW (ref 80.0–100.0)
Platelets: 426 10*3/uL — ABNORMAL HIGH (ref 150–400)
RBC: 6.1 MIL/uL — ABNORMAL HIGH (ref 3.87–5.11)
RDW: 16.5 % — ABNORMAL HIGH (ref 11.5–15.5)
WBC: 14.7 10*3/uL — ABNORMAL HIGH (ref 4.0–10.5)
nRBC: 0 % (ref 0.0–0.2)

## 2024-01-04 LAB — CBG MONITORING, ED
Glucose-Capillary: >600 mg/dL (ref 70–99)
Glucose-Capillary: >600 mg/dL (ref 70–99)

## 2024-01-04 LAB — HCG, SERUM, QUALITATIVE: Preg, Serum: NEGATIVE

## 2024-01-04 LAB — BETA-HYDROXYBUTYRIC ACID: Beta-Hydroxybutyric Acid: 3.76 mmol/L — ABNORMAL HIGH (ref 0.05–0.27)

## 2024-01-04 LAB — HEMOGLOBIN A1C
Hgb A1c MFr Bld: 12.3 % — ABNORMAL HIGH (ref 4.8–5.6)
Mean Plasma Glucose: 306.31 mg/dL

## 2024-01-04 LAB — LIPASE, BLOOD: Lipase: 143 U/L — ABNORMAL HIGH (ref 11–51)

## 2024-01-04 MED ORDER — DEXTROSE IN LACTATED RINGERS 5 % IV SOLN: INTRAVENOUS | Status: AC

## 2024-01-04 MED ORDER — DEXTROSE 50 % IV SOLN: 0.0000 mL | INTRAVENOUS | Status: DC | PRN

## 2024-01-04 MED ORDER — INSULIN REGULAR(HUMAN) IN NACL 100-0.9 UT/100ML-% IV SOLN
INTRAVENOUS | Status: DC
  Administered 2024-01-04: 9.5 [IU]/h via INTRAVENOUS
  Administered 2024-01-05: 6.5 [IU]/h via INTRAVENOUS
  Administered 2024-01-05: 7 [IU]/h via INTRAVENOUS
  Administered 2024-01-05: 4 [IU]/h via INTRAVENOUS
  Filled 2024-01-04 (×3): qty 100

## 2024-01-04 MED ORDER — ONDANSETRON 4 MG PO TBDP
8.0000 mg | ORAL_TABLET | Freq: Once | ORAL | Status: AC
  Administered 2024-01-04: 8 mg via ORAL
  Filled 2024-01-04: qty 2

## 2024-01-04 MED ORDER — LACTATED RINGERS IV SOLN: INTRAVENOUS | Status: DC

## 2024-01-04 NOTE — ED Triage Notes (Signed)
 The pt is c/o vomiting and frequent urination for the past 2-3 days no pain  lmp last month

## 2024-01-04 NOTE — ED Triage Notes (Signed)
 Patient reports that she has severe weakness from vomiting x 2 days.  Patient states she began vomiting after taking an antibiotic and states she went to [facility] and was given another pill to take for STD's

## 2024-01-04 NOTE — ED Provider Notes (Signed)
  EMERGENCY DEPARTMENT AT [facility] Provider Note   CSN: [id number] Arrival date & time: 01/04/24  1959     History {Add pertinent medical, surgical, social history, OB history to HPI:1} Chief Complaint  Patient presents with   Emesis    Megan [name] is a 43 y.o. female reactive airway disease with history of sugar drug abuse and recent antibiotic treatment for chlamydia who presents with concern for nausea and vomiting as well as significant fatigue for the last 3 days.  Additionally has significantly increased thirst and frequent urination.  No history of diabetes but strong family history.  Accompanied by her 2 sons at the bedside states that she has been much sleepier than normal and speaking slowly but has not been confused.  HPI     Home Medications Prior to Admission medications   Not on File      Allergies    Patient has no known allergies.    Review of Systems   Review of Systems  Constitutional: Negative.   HENT: Negative.    Respiratory: Negative.    Gastrointestinal:  Positive for nausea and vomiting.  Neurological:  Positive for weakness.  Psychiatric/Behavioral:  Positive for confusion.     Physical Exam Updated Vital Signs BP (!) 159/115 (BP Location: Right Arm)   Pulse (!) 115   Temp 97.7 F (36.5 C)   Resp 15   Ht 5\' 3"  (1.6 m)   Wt 125 kg   LMP 12/24/2023 (Approximate)   SpO2 99%   BMI 48.82 kg/m  Physical Exam Vitals and nursing note reviewed.  Constitutional:      Appearance: She is ill-appearing. She is not toxic-appearing.  HENT:     Head: Normocephalic and atraumatic.     Mouth/Throat:     Mouth: Mucous membranes are moist.     Pharynx: No oropharyngeal exudate or posterior oropharyngeal erythema.  Eyes:     General:        Right eye: No discharge.        Left eye: No discharge.     Extraocular Movements: Extraocular movements intact.     Conjunctiva/sclera: Conjunctivae normal.     Pupils: Pupils are  equal, round, and reactive to light.  Cardiovascular:     Rate and Rhythm: Regular rhythm. Tachycardia present.     Pulses: Normal pulses.     Heart sounds: Normal heart sounds. No murmur heard. Pulmonary:     Effort: Pulmonary effort is normal. No respiratory distress.     Breath sounds: Normal breath sounds. No wheezing or rales.  Abdominal:     General: Bowel sounds are normal. There is no distension.     Palpations: Abdomen is soft.     Tenderness: There is no abdominal tenderness. There is no guarding or rebound.  Musculoskeletal:        General: No deformity.     Cervical back: Neck supple.     Right lower leg: No edema.     Left lower leg: No edema.  Skin:    General: Skin is warm and dry.     Capillary Refill: Capillary refill takes less than 2 seconds.  Neurological:     General: No focal deficit present.     Mental Status: She is alert and oriented to person, place, and time. Mental status is at baseline.  Psychiatric:        Mood and Affect: Mood normal.     ED Results / Procedures / Treatments  Labs (all labs ordered are listed, but only abnormal results are displayed) Labs Reviewed  COMPREHENSIVE METABOLIC PANEL WITH GFR - Abnormal; Notable for the following components:      Result Value   Chloride 94 (*)    Glucose, Bld 974 (*)    Creatinine, Ser 1.58 (*)    Calcium 10.6 (*)    Total Protein 9.0 (*)    Alkaline Phosphatase 129 (*)    GFR, Estimated 41 (*)    Anion gap 23 (*)    All other components within normal limits  CBC - Abnormal; Notable for the following components:   WBC 14.7 (*)    RBC 6.10 (*)    MCV 69.8 (*)    MCH 22.3 (*)    RDW 16.5 (*)    Platelets 426 (*)    All other components within normal limits  URINALYSIS, ROUTINE W REFLEX MICROSCOPIC - Abnormal; Notable for the following components:   Color, Urine STRAW (*)    Glucose, UA >=500 (*)    Ketones, ur 5 (*)    Bacteria, UA FEW (*)    All other components within normal limits   I-STAT VENOUS BLOOD GAS, ED - Abnormal; Notable for the following components:   pCO2, Ven 41.9 (*)    All other components within normal limits  HCG, SERUM, QUALITATIVE  BETA-HYDROXYBUTYRIC ACID  OSMOLALITY  HEMOGLOBIN A1C  LIPASE, BLOOD  CBG MONITORING, ED    EKG None  Radiology No results found.  Procedures .Critical Care  Performed by: Kae Oram, PA-C Authorized by: Kae Oram, PA-C   Critical care provider statement:    Critical care time (minutes):  45   Critical care was time spent personally by me on the following activities:  Development of treatment plan with patient or surrogate, discussions with consultants, evaluation of patient's response to treatment, examination of patient, obtaining history from patient or surrogate, ordering and performing treatments and interventions, ordering and review of laboratory studies, ordering and review of radiographic studies, pulse oximetry and re-evaluation of patient's condition   {Document cardiac monitor, telemetry assessment procedure when appropriate:1}  Medications Ordered in ED Medications  insulin regular, human (MYXREDLIN) 100 units/ 100 mL infusion (has no administration in time range)  lactated ringers  infusion (has no administration in time range)  dextrose  5 % in lactated ringers  infusion (has no administration in time range)  dextrose  50 % solution 0-50 mL (has no administration in time range)  ondansetron  (ZOFRAN -ODT) disintegrating tablet 8 mg (8 mg Oral Given 01/04/24 2043)    ED Course/ Medical Decision Making/ A&P   {   Click here for ABCD2, HEART and other calculatorsREFRESH Note before signing :1}                              Medical Decision Making 43 year old female presents with concern for increased urination, thirst, nausea and vomiting.  Hypertensive tachycardic on intake.  Cardiopulmonary exam as above, abdominal exam is benign.  GCS 15 the patient is quite somnolent.  Dry  mucous membranes.  Amount and/or Complexity of Data Reviewed Labs: ordered.    Details: CBC with leukocytosis of 14.7, no anemia.  CMP with hyperglycemia of 974.  Elevated creatinine of 1.5 increase in patient's baseline 4 years ago which was 0.7.  Anion gap of 23.  UA with significant glucose, scant ketones.  VBG with normal pH 7.38, osmol 367, beta-hydroxy 3.76   Risk Prescription  drug management. Decision regarding hospitalization.   Patient will require admission to the hospital for HHS with new diagnosis diabetes.  She remains hemodynamically stable with improvement in her nausea following Zofran .  Laikyn voiced understanding of her medical evaluation and treatment plan. Each of their questions answered to their expressed satisfaction.  She is amenable to plan for admission at this time.   This chart was dictated using voice recognition software, Dragon. Despite the best efforts of this provider to proofread and correct errors, errors may still occur which can change documentation meaning.   {Document critical care time when appropriate:1} {Document review of labs and clinical decision tools ie heart score, Chads2Vasc2 etc:1}  {Document your independent review of radiology images, and any outside records:1} {Document your discussion with family members, caretakers, and with consultants:1} {Document social determinants of health affecting pt's care:1} {Document your decision making why or why not admission, treatments were needed:1} Final Clinical Impression(s) / ED Diagnoses Final diagnoses:  None    Rx / DC Orders ED Discharge Orders     None

## 2024-01-04 NOTE — ED Provider Notes (Signed)
 [facility]    CSN: [id number] Arrival date & time: 01/04/24  1614      History   Chief Complaint Chief Complaint  Patient presents with   Emesis   Weakness   Abdominal Pain    HPI Megan [name] is a 43 y.o. female.   Megan [name] is a 43 y.o. female that presents with nausea and vomiting for 2 days, accompanied by weakness, lightheadedness, and urinary incontinence. She reports being unable to keep any food or liquids down and had to leave work yesterday due to vomiting. The patient experiences urinary incontinence, particularly when standing up, and reports ongoing urinary frequency.  The patient was previously seen at [facility] on April 28th for itching, irritation, and pain with urination. At that time, she was diagnosed with a urinary tract infection (UTI) and yeast infection, and was prescribed Keflex  for the UTI and Diflucan  for the yeast infection. She was also given naproxen  for pain. The patient reports that the urinary symptoms from that visit, including pain and burning with urination, itching, and irritation, have resolved. However, she continues to experience urinary frequency.  The patient denies any current fever, chills, body aches, headache or diarrhea. She reports feeling dizzy and lightheaded, particularly when getting up or moving. The patient also mentions feeling dehydrated, with a dry mouth. She denies any current low back pain, abdominal pain (only pressure before urinating), or numbness and tingling in her face, hands, or feet.  The patient reports no regular daily medications. She denies being diabetic or having a known history of high blood pressure, though she mentions a family history of hypertension.  The following portions of the patient's history were reviewed and updated as appropriate: allergies, current medications, past family history, past medical history, past social history, past surgical history, and problem  list.          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   Cesarean delivery delivered 08/24/2013   Maternal anemia complicating pregnancy, childbirth, or the puerperium 08/24/2013   Asymptomatic bacteriuria in pregnancy 08/24/2013   Normal delivery at term 08/23/2013   Fetal heart rate decelerations affecting management of mother 08/23/2013   Gestational hypertension 08/19/2013   Obesity complicating pregnancy in third trimester 08/19/2013   Grand multipara 08/19/2013    Past Surgical History:  Procedure Laterality Date   CESAREAN SECTION WITH BILATERAL TUBAL LIGATION N/A 08/23/2013   Procedure: Primary Cesarean Section Delivery Baby Girl @ 2347, Apgars 8/9, Bilateral Tubal Ligation;  Surgeon: Abdul Hodgkin, MD;  Location: [facility];  Service: Obstetrics;  Laterality: N/A;   FINGER NAIL SURGERY     KNEE SURGERY     WISDOM TOOTH EXTRACTION      OB History     Gravida  5   Para  5   Term  4   Preterm  1   AB      Living  5      SAB      IAB      Ectopic      Multiple      Live Births  5  Home Medications    Prior to Admission medications   Not on File    Family History Family History  Problem Relation Age of Onset   Hypertension Mother    Diabetes Mother    Heart disease Mother    Sickle cell trait Mother     Social History Social History   Tobacco Use   Smoking status: Some Days    Current packs/day: 0.00    Types: Cigarettes    Last attempt to quit: 01/01/2013    Years since quitting: 11.0   Smokeless tobacco: Never  Vaping Use   Vaping status: Never Used  Substance Use Topics   Alcohol  use: No   Drug use: Yes    Frequency: 7.0 times per week    Types: Marijuana    Comment: 1 gram per day     Allergies   Patient has no known allergies.   Review of Systems Review of Systems  Constitutional:  Positive for fatigue. Negative for fever.  HENT: Negative.    Gastrointestinal:  Positive for nausea and vomiting. Negative for abdominal pain.  Genitourinary:  Positive for frequency and urgency. Negative for dysuria, flank pain, genital sores and vaginal discharge.  Musculoskeletal:  Negative for back pain.  Neurological:  Positive for dizziness, weakness and light-headedness. Negative for numbness.  All other systems reviewed and are negative.    Physical Exam Triage Vital Signs ED Triage Vitals  Encounter Vitals Group     BP 01/04/24 1628 (!) 171/99     Systolic BP Percentile --      Diastolic BP Percentile --      Pulse Rate 01/04/24 1628 94     Resp 01/04/24 1628 18     Temp 01/04/24 1628 97.8 F (36.6 C)     Temp Source 01/04/24 1628 Oral     SpO2 01/04/24 1628 97 %     Weight --      Height --      Head Circumference --      Peak Flow --      Pain Score 01/04/24 1630 8     Pain Loc --      Pain Education --      Exclude from Growth Chart --    No data found.  Updated Vital Signs BP (!) 171/99 (BP Location: Right Arm)   Pulse 94   Temp 97.8 F (36.6 C) (Oral)   Resp 18   LMP 12/24/2023 (Approximate)   SpO2 97%   Visual Acuity Right Eye Distance:   Left Eye Distance:   Bilateral Distance:    Right Eye Near:   Left Eye Near:    Bilateral Near:     Physical Exam Vitals reviewed.  Constitutional:      General: She is not in acute distress.    Appearance: Normal appearance. She is well-developed. She is obese. She is ill-appearing. She is not toxic-appearing or diaphoretic.  HENT:     Head: Normocephalic.     Mouth/Throat:     Mouth: Mucous membranes are moist.  Eyes:     General: Vision grossly intact.     Conjunctiva/sclera:  Conjunctivae normal.  Cardiovascular:     Rate and Rhythm: Normal rate and regular rhythm.     Heart sounds: Normal heart sounds.  Pulmonary:     Effort: Pulmonary effort is normal.     Breath sounds: Normal breath sounds and air entry.  Abdominal:     Palpations: Abdomen is soft.  Tenderness: There is no abdominal tenderness.  Musculoskeletal:        General: Normal range of motion.     Cervical back: Full passive range of motion without pain, normal range of motion and neck supple.     Right lower leg: No edema.     Left lower leg: No edema.  Lymphadenopathy:     Cervical: No cervical adenopathy.  Skin:    General: Skin is warm and dry.     Findings: No rash.  Neurological:     General: No focal deficit present.     Mental Status: She is oriented to person, place, and time. She is lethargic.     Sensory: Sensation is intact. No sensory deficit.     Motor: Motor function is intact.     Coordination: Coordination is intact.     Gait: Gait is intact.  Psychiatric:        Behavior: Behavior is cooperative.      UC Treatments / Results  Labs (all labs ordered are listed, but only abnormal results are displayed) Labs Reviewed - No data to display   EKG   Radiology No results found.  Procedures Procedures (including critical care time)  Medications Ordered in UC Medications - No data to display  Initial Impression / Assessment and Plan / UC Course  I have reviewed the triage vital signs and the nursing notes.  Pertinent labs & imaging results that were available during my care of the patient were reviewed by me and considered in my medical decision making (see chart for details).    43 year old female presents with nausea, vomiting, inability to tolerate oral intake, generalized weakness, lightheadedness, decreased appetite, and urinary symptoms including frequency, urgency, and incontinence. She is afebrile and nontoxic, though she appears generally weak and  somewhat drowsy. It is unclear whether her mental status changes are solely due to her current symptoms or potentially influenced by undisclosed use of controlled substances. Her blood pressure is significantly elevated at 171/99, with no prior history of hypertension. There is no evidence at this time of hypertensive urgency or emergency. She had a prior ER visit where her blood pressure was also elevated at 150/87, which may indicate previously undiagnosed hypertension, though an acute illness or volume depletion could also be contributing.  During the visit, the patient experienced an episode of urinary incontinence and reported feeling unable to hold her urine despite the urge to void. The underlying cause of her current constellation of symptoms is unclear. Given the acuity of her condition, ED evaluation is strongly recommended for further diagnostic workup and management. This should include laboratory testing to assess for dehydration, electrolyte disturbances, infection, renal function, and other potential contributing factors. Her blood pressure should also be monitored in the emergency setting, and she should follow up after stabilization to assess whether ongoing blood pressure elevation warrants long-term antihypertensive management.  The patient presents with symptoms and exam findings that warrant a higher level of care and further evaluation. Given the clinical picture, emergency department (ED) assessment is recommended for advanced diagnostics and management. The patient is currently stable and appropriate for transport via private vehicle. A responsible adult will be driving. The patient is alert, oriented, demonstrates clear mental status, good insight into their illness, and sound judgment in making decisions regarding their care. The risks, rationale, and need for ED evaluation were discussed, and the patient is agreeable to the plan.  Documentation was completed with the aid of voice  recognition software.  Transcription may contain typographical errors. Final Clinical Impressions(s) / UC Diagnoses   Final diagnoses:  Generalized weakness  Urinary incontinence, unspecified type  Nausea and vomiting, unspecified vomiting type  Elevated blood pressure reading without diagnosis of hypertension   Discharge Instructions   None    ED Prescriptions   None    PDMP not reviewed this encounter.   Maryruth Sol, [place] 01/04/24 1939

## 2024-01-05 DIAGNOSIS — E1100 Type 2 diabetes mellitus with hyperosmolarity without nonketotic hyperglycemic-hyperosmolar coma (NKHHC): Secondary | ICD-10-CM | POA: Diagnosis present

## 2024-01-05 DIAGNOSIS — Z87440 Personal history of urinary (tract) infections: Secondary | ICD-10-CM | POA: Diagnosis not present

## 2024-01-05 DIAGNOSIS — J45909 Unspecified asthma, uncomplicated: Secondary | ICD-10-CM | POA: Diagnosis present

## 2024-01-05 DIAGNOSIS — K5900 Constipation, unspecified: Secondary | ICD-10-CM | POA: Diagnosis present

## 2024-01-05 DIAGNOSIS — N179 Acute kidney failure, unspecified: Secondary | ICD-10-CM | POA: Diagnosis present

## 2024-01-05 DIAGNOSIS — D72828 Other elevated white blood cell count: Secondary | ICD-10-CM | POA: Diagnosis present

## 2024-01-05 DIAGNOSIS — E869 Volume depletion, unspecified: Secondary | ICD-10-CM | POA: Diagnosis present

## 2024-01-05 DIAGNOSIS — D573 Sickle-cell trait: Secondary | ICD-10-CM | POA: Diagnosis present

## 2024-01-05 DIAGNOSIS — E11649 Type 2 diabetes mellitus with hypoglycemia without coma: Secondary | ICD-10-CM | POA: Diagnosis present

## 2024-01-05 DIAGNOSIS — Z8249 Family history of ischemic heart disease and other diseases of the circulatory system: Secondary | ICD-10-CM | POA: Diagnosis not present

## 2024-01-05 DIAGNOSIS — Z833 Family history of diabetes mellitus: Secondary | ICD-10-CM | POA: Diagnosis not present

## 2024-01-05 DIAGNOSIS — Z6841 Body Mass Index (BMI) 40.0 and over, adult: Secondary | ICD-10-CM | POA: Diagnosis not present

## 2024-01-05 DIAGNOSIS — Z832 Family history of diseases of the blood and blood-forming organs and certain disorders involving the immune mechanism: Secondary | ICD-10-CM | POA: Diagnosis not present

## 2024-01-05 DIAGNOSIS — E876 Hypokalemia: Secondary | ICD-10-CM | POA: Diagnosis present

## 2024-01-05 DIAGNOSIS — E870 Hyperosmolality and hypernatremia: Secondary | ICD-10-CM | POA: Diagnosis not present

## 2024-01-05 LAB — BASIC METABOLIC PANEL WITH GFR
Anion gap: 15 (ref 5–15)
Anion gap: 13 (ref 5–15)
Anion gap: 7 (ref 5–15)
Anion gap: 13 (ref 5–15)
BUN: 17 mg/dL (ref 6–20)
BUN: 16 mg/dL (ref 6–20)
BUN: 16 mg/dL (ref 6–20)
BUN: 16 mg/dL (ref 6–20)
CO2: 25 mmol/L (ref 22–32)
CO2: 29 mmol/L (ref 22–32)
CO2: 30 mmol/L (ref 22–32)
CO2: 23 mmol/L (ref 22–32)
Calcium: 10.4 mg/dL — ABNORMAL HIGH (ref 8.9–10.3)
Calcium: 10.8 mg/dL — ABNORMAL HIGH (ref 8.9–10.3)
Calcium: 9.9 mg/dL (ref 8.9–10.3)
Calcium: 9.5 mg/dL (ref 8.9–10.3)
Chloride: 103 mmol/L (ref 98–111)
Chloride: 108 mmol/L (ref 98–111)
Chloride: 111 mmol/L (ref 98–111)
Chloride: 112 mmol/L — ABNORMAL HIGH (ref 98–111)
Creatinine, Ser: 1.4 mg/dL — ABNORMAL HIGH (ref 0.44–1.00)
Creatinine, Ser: 1.13 mg/dL — ABNORMAL HIGH (ref 0.44–1.00)
Creatinine, Ser: 0.88 mg/dL (ref 0.44–1.00)
Creatinine, Ser: 0.93 mg/dL (ref 0.44–1.00)
GFR, Estimated: 48 mL/min — ABNORMAL LOW (ref >60)
GFR, Estimated: >60 mL/min (ref >60)
GFR, Estimated: >60 mL/min (ref >60)
GFR, Estimated: >60 mL/min (ref >60)
Glucose, Bld: 678 mg/dL (ref 70–99)
Glucose, Bld: 296 mg/dL — ABNORMAL HIGH (ref 70–99)
Glucose, Bld: 145 mg/dL — ABNORMAL HIGH (ref 70–99)
Glucose, Bld: 133 mg/dL — ABNORMAL HIGH (ref 70–99)
Potassium: 3.3 mmol/L — ABNORMAL LOW (ref 3.5–5.1)
Potassium: 3.5 mmol/L (ref 3.5–5.1)
Potassium: 4.4 mmol/L (ref 3.5–5.1)
Potassium: 3.9 mmol/L (ref 3.5–5.1)
Sodium: 143 mmol/L (ref 135–145)
Sodium: 150 mmol/L — ABNORMAL HIGH (ref 135–145)
Sodium: 148 mmol/L — ABNORMAL HIGH (ref 135–145)
Sodium: 148 mmol/L — ABNORMAL HIGH (ref 135–145)

## 2024-01-05 LAB — GLUCOSE, CAPILLARY
Glucose-Capillary: 142 mg/dL — ABNORMAL HIGH (ref 70–99)
Glucose-Capillary: 151 mg/dL — ABNORMAL HIGH (ref 70–99)
Glucose-Capillary: 155 mg/dL — ABNORMAL HIGH (ref 70–99)
Glucose-Capillary: 167 mg/dL — ABNORMAL HIGH (ref 70–99)
Glucose-Capillary: 179 mg/dL — ABNORMAL HIGH (ref 70–99)
Glucose-Capillary: 180 mg/dL — ABNORMAL HIGH (ref 70–99)
Glucose-Capillary: 190 mg/dL — ABNORMAL HIGH (ref 70–99)
Glucose-Capillary: 190 mg/dL — ABNORMAL HIGH (ref 70–99)
Glucose-Capillary: 205 mg/dL — ABNORMAL HIGH (ref 70–99)
Glucose-Capillary: 221 mg/dL — ABNORMAL HIGH (ref 70–99)
Glucose-Capillary: 298 mg/dL — ABNORMAL HIGH (ref 70–99)

## 2024-01-05 LAB — CBC
HCT: 38 % (ref 36.0–46.0)
Hemoglobin: 12.3 g/dL (ref 12.0–15.0)
MCH: 22.5 pg — ABNORMAL LOW (ref 26.0–34.0)
MCHC: 32.4 g/dL (ref 30.0–36.0)
MCV: 69.6 fL — ABNORMAL LOW (ref 80.0–100.0)
Platelets: 338 10*3/uL (ref 150–400)
RBC: 5.46 MIL/uL — ABNORMAL HIGH (ref 3.87–5.11)
RDW: 15.7 % — ABNORMAL HIGH (ref 11.5–15.5)
WBC: 16.1 10*3/uL — ABNORMAL HIGH (ref 4.0–10.5)
nRBC: 0 % (ref 0.0–0.2)

## 2024-01-05 LAB — CBC WITH DIFFERENTIAL/PLATELET
Abs Immature Granulocytes: 0.1 10*3/uL — ABNORMAL HIGH (ref 0.00–0.07)
Basophils Absolute: 0.1 10*3/uL (ref 0.0–0.1)
Basophils Relative: 0 %
Eosinophils Absolute: 0 10*3/uL (ref 0.0–0.5)
Eosinophils Relative: 0 %
HCT: 38.6 % (ref 36.0–46.0)
Hemoglobin: 12.5 g/dL (ref 12.0–15.0)
Immature Granulocytes: 1 %
Lymphocytes Relative: 18 %
Lymphs Abs: 3.5 10*3/uL (ref 0.7–4.0)
MCH: 22.6 pg — ABNORMAL LOW (ref 26.0–34.0)
MCHC: 32.4 g/dL (ref 30.0–36.0)
MCV: 69.7 fL — ABNORMAL LOW (ref 80.0–100.0)
Monocytes Absolute: 1.1 10*3/uL — ABNORMAL HIGH (ref 0.1–1.0)
Monocytes Relative: 6 %
Neutro Abs: 15 10*3/uL — ABNORMAL HIGH (ref 1.7–7.7)
Neutrophils Relative %: 75 %
Platelets: 408 10*3/uL — ABNORMAL HIGH (ref 150–400)
RBC: 5.54 MIL/uL — ABNORMAL HIGH (ref 3.87–5.11)
RDW: 15.6 % — ABNORMAL HIGH (ref 11.5–15.5)
WBC: 19.7 10*3/uL — ABNORMAL HIGH (ref 4.0–10.5)
nRBC: 0 % (ref 0.0–0.2)

## 2024-01-05 LAB — CBG MONITORING, ED
Glucose-Capillary: 198 mg/dL — ABNORMAL HIGH (ref 70–99)
Glucose-Capillary: 211 mg/dL — ABNORMAL HIGH (ref 70–99)
Glucose-Capillary: 218 mg/dL — ABNORMAL HIGH (ref 70–99)
Glucose-Capillary: 227 mg/dL — ABNORMAL HIGH (ref 70–99)
Glucose-Capillary: 246 mg/dL — ABNORMAL HIGH (ref 70–99)
Glucose-Capillary: 296 mg/dL — ABNORMAL HIGH (ref 70–99)
Glucose-Capillary: 383 mg/dL — ABNORMAL HIGH (ref 70–99)
Glucose-Capillary: 442 mg/dL — ABNORMAL HIGH (ref 70–99)
Glucose-Capillary: 490 mg/dL — ABNORMAL HIGH (ref 70–99)
Glucose-Capillary: 508 mg/dL (ref 70–99)
Glucose-Capillary: 524 mg/dL (ref 70–99)
Glucose-Capillary: >600 mg/dL (ref 70–99)
Glucose-Capillary: >600 mg/dL (ref 70–99)

## 2024-01-05 LAB — HIV ANTIBODY (ROUTINE TESTING W REFLEX): HIV Screen 4th Generation wRfx: NONREACTIVE

## 2024-01-05 MED ORDER — POTASSIUM CHLORIDE 10 MEQ/100ML IV SOLN
10.0000 meq | INTRAVENOUS | Status: AC
  Administered 2024-01-05 (×2): 10 meq via INTRAVENOUS
  Filled 2024-01-05 (×2): qty 100

## 2024-01-05 MED ORDER — ACETAMINOPHEN 325 MG PO TABS: 650.0000 mg | ORAL_TABLET | Freq: Four times a day (QID) | ORAL | Status: DC | PRN

## 2024-01-05 MED ORDER — POTASSIUM CHLORIDE 10 MEQ/100ML IV SOLN
10.0000 meq | INTRAVENOUS | Status: DC
Start: 2024-01-05 — End: 2024-01-05

## 2024-01-05 MED ORDER — INSULIN STARTER KIT- PEN NEEDLES (ENGLISH)
1.0000 | Freq: Once | Status: AC
  Administered 2024-01-05: 1
  Filled 2024-01-05: qty 1

## 2024-01-05 MED ORDER — ENOXAPARIN SODIUM 40 MG/0.4ML IJ SOSY
40.0000 mg | PREFILLED_SYRINGE | INTRAMUSCULAR | Status: DC
  Administered 2024-01-05 – 2024-01-07 (×3): 40 mg via SUBCUTANEOUS
  Filled 2024-01-05 (×3): qty 0.4

## 2024-01-05 MED ORDER — ONDANSETRON HCL 4 MG/2ML IJ SOLN: 4.0000 mg | Freq: Four times a day (QID) | INTRAMUSCULAR | Status: DC | PRN

## 2024-01-05 MED ORDER — LIVING WELL WITH DIABETES BOOK
Freq: Once | Status: AC
  Filled 2024-01-05: qty 1

## 2024-01-05 MED ORDER — INSULIN GLARGINE-YFGN 100 UNIT/ML ~~LOC~~ SOLN
20.0000 [IU] | Freq: Two times a day (BID) | SUBCUTANEOUS | Status: DC
Start: 2024-01-05 — End: 2024-01-06
  Administered 2024-01-05 (×2): 20 [IU] via SUBCUTANEOUS
  Filled 2024-01-05 (×6): qty 0.2

## 2024-01-05 MED ORDER — POTASSIUM CHLORIDE 10 MEQ/100ML IV SOLN
10.0000 meq | INTRAVENOUS | Status: AC
  Administered 2024-01-05 (×3): 10 meq via INTRAVENOUS
  Filled 2024-01-05 (×3): qty 100

## 2024-01-05 MED ORDER — SODIUM CHLORIDE 0.45 % IV SOLN: INTRAVENOUS | Status: AC

## 2024-01-05 MED ORDER — LACTATED RINGERS IV BOLUS
20.0000 mL/kg | Freq: Once | INTRAVENOUS | Status: AC
  Administered 2024-01-05: 2500 mL via INTRAVENOUS

## 2024-01-05 NOTE — ED Notes (Signed)
 Paged and notified MD Rathore patient potassium level 3.3. still receiving continuous insulin infusion (SEE MAR)

## 2024-01-05 NOTE — Plan of Care (Signed)

## 2024-01-05 NOTE — H&P (Signed)
 History and Physical    Megan [name] [id number]:[id number] DOB: 07/17/1981 DOA: 01/04/2024  PCP: [facility]  Patient coming from: Home  I have personally briefly reviewed patient's old medical records in [facility]  Chief Complaint: Nausea, vomiting, urinary frequency  HPI: Megan [name] is a 43 y.o. female with medical history significant for iron  deficiency who presented to the ED for evaluation of nausea, vomiting, increased urinary frequency.  [facility] was [facility] in [facility] on 12/24/2023 with vaginal itching and dysuria.  UA was suggestive of UTI.  Wet prep positive for yeast.  GC/chlamydia probe was negative.  Urine pregnancy test was negative and also showed >1000 glucose.  She was given a course of Keflex  for treatment of UTI.  Patient states for the last 3 days she has had nausea, vomiting, and frequent urination without dysuria.  She has not really had any abdominal pain.  She has no prior known history of diabetes.  She says she is not taking any medications regularly.  ED Course  Labs/Imaging on admission: I have personally reviewed following labs and imaging studies.  Initial vitals showed BP 171/99, pulse 94, RR 18, temp 97.8 F, SpO2 97% on room air.  Labs showed serum glucose 974, sodium 139, potassium 3.7, bicarb 22, BUN 15, creatinine 1.58, lipase 143, AST 24, ALT 41, alk phos 129, WBC 14.7, hemoglobin 13.6, platelets 426.  Serum hCG negative.  Beta hydroxybutyrate 3.76, serum osmolality 367, hemoglobin A1c 12.3.  Patient was started on insulin infusion and maintenance fluids.  The hospitalist service was consulted to admit.  Review of Systems: All systems reviewed and are negative except as documented in history of present illness above.   Past Medical History:  Diagnosis Date   Anemia    Obese    Sickle cell trait (HCC)    Thyroid disease     Past Surgical History:  Procedure Laterality Date   CESAREAN SECTION WITH  BILATERAL TUBAL LIGATION N/A 08/23/2013   Procedure: Primary Cesarean Section Delivery Baby Girl @ 2347, Apgars 8/9, Bilateral Tubal Ligation;  Surgeon: Abdul Hodgkin, MD;  Location: [facility];  Service: Obstetrics;  Laterality: N/A;   FINGER NAIL SURGERY     KNEE SURGERY     WISDOM TOOTH EXTRACTION      Social History: Social History   Tobacco Use   Smoking status: Some Days    Current packs/day: 0.00    Types: Cigarettes    Last attempt to quit: 01/01/2013    Years since quitting: 11.0   Smokeless tobacco: Never  Vaping Use   Vaping status: Never Used  Substance Use Topics   Alcohol use: No   Drug use: Yes    Frequency: 7.0 times per week    Types: Marijuana    Comment: 1 gram per day   No Known Allergies  Family History  Problem Relation Age of Onset   Hypertension Mother    Diabetes Mother    Heart disease Mother    Sickle cell trait Mother      Prior to Admission medications   Not on File    Physical Exam: Vitals:   01/05/24 0015 01/05/24 0030 01/05/24 0045 01/05/24 0048  BP: (!) 154/103 (!) 162/105 (!) 150/102 (!) 150/92  Pulse: (!) 108 (!) 111 (!) 108 (!) 101  Resp: (!) 27 (!) 27 (!) 27 18  Temp:    98 F (36.7 C)  SpO2: 95% 98% 97% 100%  Weight:  Height:       Constitutional: Resting in bed, somnolent but easily awakens to voice.  NAD. Eyes: EOMI, lids and conjunctivae normal ENMT: Mucous membranes are dry. Posterior pharynx clear of any exudate or lesions.Normal dentition.  Neck: normal, supple, no masses. Respiratory: clear to auscultation bilaterally, no wheezing, no crackles. Normal respiratory effort. No accessory muscle use.  Cardiovascular: Regular rate and rhythm, no murmurs / rubs / gallops. No extremity edema. 2+ pedal pulses. Abdomen: no tenderness, no masses palpated. Musculoskeletal: no clubbing / cyanosis. No joint deformity upper and lower extremities. Good ROM, no contractures. Normal muscle tone.  Skin: no ulcers. No  induration Neurologic: Sensation intact. Strength 5/5 in all 4.  Psychiatric: Normal judgment and insight. Alert and oriented x 3. Normal mood.   EKG: Personally reviewed. Sinus rhythm, rate 98, no acute ischemic changes  Assessment/Plan Principal Problem:   Type 2 diabetes mellitus with hyperosmolar hyperglycemic state (HHS) (HCC) Active Problems:   AKI (acute kidney injury) (HCC)   Megan [name] is a 43 y.o. female with medical history significant for iron  deficiency who is admitted with HHS with new diagnosis of diabetes.  Assessment and Plan: Hyperosmolar hyperglycemic state with new diagnosis of diabetes: Serum glucose 974 with serum osmolality 367.  Hemoglobin A1c 12.3. - Give 2.5 L LR bolus - IV potassium 10 mEq x 2 runs - Continue IV insulin infusion - Continue IV fluids as ordered - Serial BMET and transition to SQ insulin when indicated  Acute kidney injury: Insetting of severe hyperglycemia and volume depletion with creatinine 1.58 on admission.  Renal function was normal on last labs in 2020. - IV fluids as above and monitor serial labs  Elevated lipase: Mildly elevated lipase of 143 in setting of severe hyperglycemia.  Patient does not have any epigastric pain.  Doubt pancreatitis, she is n.p.o. and receiving IV fluids as above regardless.  Leukocytosis: Likely reactive process.  Recently completed antibiotics for UTI.  UA this admission is negative for UTI.   DVT prophylaxis: enoxaparin (LOVENOX) injection 40 mg Start: 01/05/24 1000 Code Status: Full code Family Communication: Sons at bedside Disposition Plan: From home and likely discharge to home pending clinical progress Consults called: None Severity of Illness: The appropriate patient status for this patient is INPATIENT. Inpatient status is judged to be reasonable and necessary in order to provide the required intensity of service to ensure the patient's safety. The patient's presenting symptoms,  physical exam findings, and initial radiographic and laboratory data in the context of their chronic comorbidities is felt to place them at high risk for further clinical deterioration. Furthermore, it is not anticipated that the patient will be medically stable for discharge from the hospital within 2 midnights of admission.   * I certify that at the point of admission it is my clinical judgment that the patient will require inpatient hospital care spanning beyond 2 midnights from the point of admission due to high intensity of service, high risk for further deterioration and high frequency of surveillance required.Edith Gores MD [facility]  If 7PM-7AM, please contact night-coverage [email or web address]  01/05/2024, 12:58 AM

## 2024-01-05 NOTE — H&P (Incomplete)
  History and Physical    Megan [name] GNF:[id number] DOB: Oct 18, 1980 DOA: 01/04/2024  [facility]: [facility]  Patient coming from: ***  I have personally briefly reviewed patient's old medical records in [facility]  Chief Complaint: ***  HPI: Megan [name] is a 43 y.o. female with medical history significant for ***   ED Course  Labs/Imaging on admission: I have personally reviewed following labs and imaging studies.  ***  Review of Systems: All systems reviewed and are negative except as documented in history of present illness above.   Past Medical History:  Diagnosis Date  . Anemia   . Obese   . Sickle cell trait (HCC)   . Thyroid disease     Past Surgical History:  Procedure Laterality Date  . CESAREAN SECTION WITH BILATERAL TUBAL LIGATION N/A 08/23/2013   Procedure: Primary Cesarean Section Delivery Baby Girl @ 2347, Apgars 8/9, Bilateral Tubal Ligation;  Surgeon: Abdul Hodgkin, MD;  Location: [facility];  Service: Obstetrics;  Laterality: N/A;  . FINGER NAIL SURGERY    . KNEE SURGERY    . WISDOM TOOTH EXTRACTION      Social History: ***  No Known Allergies  Family History  Problem Relation Age of Onset  . Hypertension Mother   . Diabetes Mother   . Heart disease Mother   . Sickle cell trait Mother      Prior to Admission medications   Not on File    Physical Exam: Vitals:   01/04/24 2015 01/04/24 2038 01/04/24 2330  BP: (!) 159/115  (!) 150/98  Pulse: (!) 115  97  Resp: 15  (!) 28  Temp: 97.7 F (36.5 C)    SpO2: 99%  97%  Weight:  125 kg   Height:  5\' 3"  (1.6 m)    *** Constitutional: NAD, calm, comfortable Eyes: PERRL, lids and conjunctivae normal ENMT: Mucous membranes are moist. Posterior pharynx clear of any exudate or lesions.Normal dentition.  Neck: normal, supple, no masses. Respiratory: clear to auscultation bilaterally, no wheezing, no crackles. Normal respiratory effort. No accessory muscle use.   Cardiovascular: Regular rate and rhythm, no murmurs / rubs / gallops. No extremity edema. 2+ pedal pulses. Abdomen: no tenderness, no masses palpated. Musculoskeletal: no clubbing / cyanosis. No joint deformity upper and lower extremities. Good ROM, no contractures. Normal muscle tone.  Skin: no rashes, lesions, ulcers. No induration Neurologic: Sensation intact. Strength 5/5 in all 4.  Psychiatric: Normal judgment and insight. Alert and oriented x 3. Normal mood.   EKG: Personally reviewed. ***  Assessment/Plan Active Problems:   * No active hospital problems. *   *** No notes on file *** Assessment and Plan: No notes have been filed under this hospital service. Service: Hospitalist      DVT prophylaxis: ***  Code Status: ***  Family Communication: ***  Disposition Plan: ***  Consults called: ***  Severity of Illness: {Observation/Inpatient:21159}  Edith Gores MD [facility]  If 7PM-7AM, please contact night-coverage [email or web address]  01/05/2024, 12:02 AM

## 2024-01-05 NOTE — Inpatient Diabetes Management (Signed)
 Inpatient Diabetes Program Recommendations  AACE/ADA: New Consensus Statement on Inpatient Glycemic Control (2015)  Target Ranges:  Prepandial:   less than 140 mg/dL      Peak postprandial:   less than 180 mg/dL (1-2 hours)      Critically ill patients:  140 - 180 mg/dL   Lab Results  Component Value Date   GLUCAP 142 (H) 01/05/2024   HGBA1C 12.3 (H) 01/04/2024    Review of Glycemic Control  Diabetes history: None - New onset DM Outpatient Diabetes medications: None Current orders for Inpatient glycemic control: IV insulin per [employer] for HHNK. Semglee 20 units BID  Received Semglee 20 units at 0800 this am.  Inpatient Diabetes Program Recommendations:    Continues on IV insulin although pt received Semglee 20 units at 0800 this am.   Will order Living Well with Diabetes book and Insulin pen starter kit  Will speak with pt regarding new diagnosis in am.  When transitioning off drip, add Novolog 0-15 TID and 0-5 HS  Diabetes Coordinator to see pt on 5/12 regarding new diagnosis, teach insulin pen administration and recommendations regarding CGM.  Will need to f/u with PCP at discharge.   Thank you. Joni Net, RD, LDN, CDCES Inpatient Diabetes Coordinator [phone]

## 2024-01-05 NOTE — Hospital Course (Signed)
 Megan [name] is a 43 y.o. female with medical history significant for iron  deficiency who is admitted with HHS with new diagnosis of diabetes.

## 2024-01-05 NOTE — Progress Notes (Signed)
 [facility] PROGRESS NOTE    Progress Note  Megan [name]  ZOX:[id number] DOB: 1980-10-29 DOA: 01/04/2024 PCP: [facility]     Brief Narrative:   Megan [name] is an 43 y.o. female past medical history significant for iron  deficiency anemia presents with nausea vomiting and increased frequency wet prep was positive for yeast urine pregnancy test negative glucose was greater than 1000, blood glucose was 974.  Assessment/Plan:   Hyperglycemic hyperosmolar nonketotic state/ Type 2 diabetes mellitus with hyperosmolar hyperglycemic state (HHS) [facility]): Hemoglobin A1c of 12.3, she was started on IV insulin and IV fluids. Monitor strict I's and O's and daily weights. Once she gets below 250 will add long-acting insulin plus sliding scale. Get a BMP at every 4 hours.  AKI (acute kidney injury) (HCC) Likely hemodynamically mediated started on IV fluids her creatinine returned back to baseline. Baseline creatinine of less than 1.  Elevated lipase: She denies any abdominal pain likely acting as a as an acute phase reactant.  Leukocytosis: Likely reactive.  DVT prophylaxis: loveno Family Communication:none Status is: Inpatient Remains inpatient appropriate because: [place]    Code Status:     Code Status Orders  (From admission, onward)           Start     Ordered   01/05/24 0028  Full code  Continuous       Question:  By:  Answer:  Consent: discussion documented in EHR   01/05/24 0030           Code Status History     Date Active Date Inactive Code Status Order ID Comments User Context   12/23/2017 1752 12/26/2017 1423 Full Code [id number]  [name], NP Inpatient   12/23/2017 0731 12/23/2017 1716 Full Code [id number]  Ellis Guys, PA-C ED   08/24/2013 0255 08/27/2013 1519 Full Code [id number]  Abdul Hodgkin, MD Inpatient         IV Access:   Peripheral IV   Procedures and diagnostic studies:   No results  found.   Medical Consultants:   None.   Subjective:    [name] relates she still thirsty.  Objective:    Vitals:   01/05/24 0500 01/05/24 0530 01/05/24 0600 01/05/24 0630  BP: (!) 134/113 137/83 132/81 126/88  Pulse: 78 81 77 80  Resp: (!) 26 (!) 24 (!) 23 18  Temp:      TempSrc:      SpO2: 99% 99% 98% 100%  Weight:      Height:       SpO2: 100 %   Intake/Output Summary (Last 24 hours) at 01/05/2024 0641 Last data filed at 01/05/2024 0616 Gross per 24 hour  Intake --  Output 1100 ml  Net -1100 ml   Filed Weights   01/04/24 2038  Weight: 125 kg    Exam: General exam: In no acute distress. Respiratory system: Good air movement and clear to auscultation. Cardiovascular system: S1 & S2 heard, RRR. No JVD. Gastrointestinal system: Abdomen is nondistended, soft and nontender.  Extremities: No pedal edema. Skin: No rashes, lesions or ulcers Psychiatry: Judgement and insight appear normal. Mood & affect appropriate.    Data Reviewed:    Labs: Basic Metabolic Panel: Recent Labs  Lab 01/04/24 2105 01/04/24 2235 01/05/24 0153  NA 139 136 143  K 3.7 4.0 3.3*  CL 94*  --  103  CO2 22  --  25  GLUCOSE 974*  --  678*  BUN 15  --  17  CREATININE 1.58*  --  1.40*  CALCIUM 10.6*  --  10.4*   GFR Estimated Creatinine Clearance: 66.6 mL/min (A) (by C-G formula based on SCr of 1.4 mg/dL (H)). Liver Function Tests: Recent Labs  Lab 01/04/24 2105  AST 24  ALT 41  ALKPHOS 129*  BILITOT 1.2  PROT 9.0*  ALBUMIN 4.1   Recent Labs  Lab 01/04/24 2230  LIPASE 143*   No results for input(s): "AMMONIA" in the last 168 hours. Coagulation profile No results for input(s): "INR", "PROTIME" in the last 168 hours. COVID-19 Labs  No results for input(s): "DDIMER", "FERRITIN", "LDH", "CRP" in the last 72 hours.  Lab Results  Component Value Date   SARSCOV2NAA Not Detected 09/22/2019    CBC: Recent Labs  Lab 01/04/24 2105 01/04/24 2235  01/05/24 0153  WBC 14.7*  --  16.1*  HGB 13.6 14.6 12.3  HCT 42.6 43.0 38.0  MCV 69.8*  --  69.6*  PLT 426*  --  338   Cardiac Enzymes: No results for input(s): "CKTOTAL", "CKMB", "CKMBINDEX", "TROPONINI" in the last 168 hours. BNP (last 3 results) No results for input(s): "PROBNP" in the last 8760 hours. CBG: Recent Labs  Lab 01/05/24 0322 01/05/24 0351 01/05/24 0433 01/05/24 0535 01/05/24 0631  GLUCAP 490* 442* 383* 296* 246*   D-Dimer: No results for input(s): "DDIMER" in the last 72 hours. Hgb A1c: Recent Labs    01/04/24 2230  HGBA1C 12.3*   Lipid Profile: No results for input(s): "CHOL", "HDL", "LDLCALC", "TRIG", "CHOLHDL", "LDLDIRECT" in the last 72 hours. Thyroid function studies: No results for input(s): "TSH", "T4TOTAL", "T3FREE", "THYROIDAB" in the last 72 hours.  Invalid input(s): "FREET3" Anemia work up: No results for input(s): "VITAMINB12", "FOLATE", "FERRITIN", "TIBC", "IRON ", "RETICCTPCT" in the last 72 hours. Sepsis Labs: Recent Labs  Lab 01/04/24 2105 01/05/24 0153  WBC 14.7* 16.1*   Microbiology No results found for this or any previous visit (from the past 240 hours).   Medications:    enoxaparin (LOVENOX) injection  40 mg Subcutaneous Q24H   Continuous Infusions:  dextrose  5% lactated ringers  125 mL/hr at 01/05/24 0635   insulin 7 Units/hr (01/05/24 0632)   lactated ringers  Stopped (01/05/24 0981)   potassium chloride  10 mEq (01/05/24 0639)      LOS: 0 days   Macdonald Savoy  [facility]  01/05/2024, 6:41 AM

## 2024-01-05 NOTE — ED Notes (Signed)
Repeat BMP collected and sent to lab.

## 2024-01-06 DIAGNOSIS — E876 Hypokalemia: Secondary | ICD-10-CM | POA: Diagnosis not present

## 2024-01-06 DIAGNOSIS — E1100 Type 2 diabetes mellitus with hyperosmolarity without nonketotic hyperglycemic-hyperosmolar coma (NKHHC): Secondary | ICD-10-CM | POA: Diagnosis not present

## 2024-01-06 DIAGNOSIS — N179 Acute kidney failure, unspecified: Secondary | ICD-10-CM | POA: Diagnosis not present

## 2024-01-06 LAB — BASIC METABOLIC PANEL WITH GFR
Anion gap: 7 (ref 5–15)
BUN: 14 mg/dL (ref 6–20)
CO2: 28 mmol/L (ref 22–32)
Calcium: 8.8 mg/dL — ABNORMAL LOW (ref 8.9–10.3)
Chloride: 109 mmol/L (ref 98–111)
Creatinine, Ser: 0.9 mg/dL (ref 0.44–1.00)
GFR, Estimated: >60 mL/min (ref >60)
Glucose, Bld: 150 mg/dL — ABNORMAL HIGH (ref 70–99)
Potassium: 3.1 mmol/L — ABNORMAL LOW (ref 3.5–5.1)
Sodium: 144 mmol/L (ref 135–145)

## 2024-01-06 LAB — GLUCOSE, CAPILLARY
Glucose-Capillary: 139 mg/dL — ABNORMAL HIGH (ref 70–99)
Glucose-Capillary: 149 mg/dL — ABNORMAL HIGH (ref 70–99)
Glucose-Capillary: 154 mg/dL — ABNORMAL HIGH (ref 70–99)
Glucose-Capillary: 141 mg/dL — ABNORMAL HIGH (ref 70–99)
Glucose-Capillary: 217 mg/dL — ABNORMAL HIGH (ref 70–99)
Glucose-Capillary: 276 mg/dL — ABNORMAL HIGH (ref 70–99)
Glucose-Capillary: 253 mg/dL — ABNORMAL HIGH (ref 70–99)
Glucose-Capillary: 46 mg/dL — ABNORMAL LOW (ref 70–99)
Glucose-Capillary: 59 mg/dL — ABNORMAL LOW (ref 70–99)
Glucose-Capillary: 91 mg/dL (ref 70–99)
Glucose-Capillary: 211 mg/dL — ABNORMAL HIGH (ref 70–99)
Glucose-Capillary: 389 mg/dL — ABNORMAL HIGH (ref 70–99)

## 2024-01-06 LAB — BETA-HYDROXYBUTYRIC ACID: Beta-Hydroxybutyric Acid: 0.07 mmol/L (ref 0.05–0.27)

## 2024-01-06 MED ORDER — INSULIN GLARGINE-YFGN 100 UNIT/ML ~~LOC~~ SOLN
12.0000 [IU] | Freq: Every day | SUBCUTANEOUS | Status: DC
  Administered 2024-01-06: 12 [IU] via SUBCUTANEOUS
  Filled 2024-01-06 (×2): qty 0.12

## 2024-01-06 MED ORDER — INSULIN ASPART 100 UNIT/ML IJ SOLN
0.0000 [IU] | Freq: Three times a day (TID) | INTRAMUSCULAR | Status: DC
  Administered 2024-01-06 – 2024-01-07 (×2): 3 [IU] via SUBCUTANEOUS
  Administered 2024-01-07 (×2): 7 [IU] via SUBCUTANEOUS

## 2024-01-06 MED ORDER — SENNOSIDES-DOCUSATE SODIUM 8.6-50 MG PO TABS
2.0000 | ORAL_TABLET | Freq: Every day | ORAL | Status: DC
  Administered 2024-01-06: 2 via ORAL
  Filled 2024-01-06: qty 2

## 2024-01-06 MED ORDER — BISACODYL 10 MG RE SUPP
10.0000 mg | Freq: Once | RECTAL | Status: AC
  Administered 2024-01-06: 10 mg via RECTAL
  Filled 2024-01-06: qty 1

## 2024-01-06 MED ORDER — POTASSIUM CHLORIDE CRYS ER 20 MEQ PO TBCR
40.0000 meq | EXTENDED_RELEASE_TABLET | Freq: Four times a day (QID) | ORAL | Status: AC
  Administered 2024-01-06 (×2): 40 meq via ORAL
  Filled 2024-01-06 (×2): qty 2

## 2024-01-06 MED ORDER — POLYETHYLENE GLYCOL 3350 17 G PO PACK
17.0000 g | PACK | Freq: Two times a day (BID) | ORAL | Status: DC
  Administered 2024-01-06 – 2024-01-07 (×2): 17 g via ORAL
  Filled 2024-01-06 (×3): qty 1

## 2024-01-06 MED ORDER — METOCLOPRAMIDE HCL 5 MG/ML IJ SOLN
5.0000 mg | Freq: Three times a day (TID) | INTRAMUSCULAR | Status: AC
  Administered 2024-01-06 – 2024-01-07 (×3): 5 mg via INTRAVENOUS
  Filled 2024-01-06 (×3): qty 2

## 2024-01-06 NOTE — Progress Notes (Addendum)
 PROGRESS NOTE        PATIENT DETAILS Name: Megan [name] Age: 43 y.o. Sex: female Date of Birth: 12/04/1980 Admit Date: 01/04/2024 Admitting Physician Kenny Peals, MD PCP:[facility]  Brief Summary: Patient is a 43 y.o.  female presented with nausea/vomiting/polyuria-found to have new onset DM-2 with HHS.  Significant events: 5/9>> admit to [facility]  Significant studies: None  Significant microbiology data: None  Procedures: None  Consults: None  Subjective: Nauseous-1 episode of vomiting this morning.  Has not had a BM in 7 days but is passing flatus.  No abdominal pain or distention.  Objective: Vitals: Blood pressure 110/68, pulse 68, temperature 98.1 F (36.7 C), temperature source Oral, resp. rate 18, height 5\' 3"  (1.6 m), weight 125 kg, last menstrual period 12/24/2023, SpO2 98%, currently breastfeeding.   Exam: Gen Exam:Alert awake-not in any distress HEENT:atraumatic, normocephalic Chest: B/L clear to auscultation anteriorly CVS:S1S2 regular Abdomen:soft non tender, non distended Extremities:no edema Neurology: Non focal Skin: no rash  Pertinent Labs/Radiology:    Latest Ref Rng & Units 01/05/2024    7:00 AM 01/05/2024    1:53 AM 01/04/2024   10:35 PM  CBC  WBC 4.0 - 10.5 K/uL 19.7  16.1    Hemoglobin 12.0 - 15.0 g/dL 95.6  21.3  08.6   Hematocrit 36.0 - 46.0 % 38.6  38.0  43.0   Platelets 150 - 400 K/uL 408  338      Lab Results  Component Value Date   NA 144 01/06/2024   K 3.1 (L) 01/06/2024   CL 109 01/06/2024   CO2 28 01/06/2024      Assessment/Plan: HHS Continue insulin infusion Once vomiting stops-will transition to SQ insulin  Addendum: Developed hypoglycemia-while on Insulin gtt-no vomiting-diet being advanced-will transition to Semglee/SSI  New onset DM-2 (A1c 12.3 on 5/9) Still vomiting-continue insulin infusion Will need extensive diabetic education/insulin education prior to  discharge.  Vomiting Benign abdominal exam Lipase minimally elevated but this is likely secondary to HHS Suspect this is from HHS/constipation-possibly gastroparesis Will try a few doses of Reglan  Treat underlying constipation Downgrade to clear liquids Reassess-if no improvement-imaging studies.  AKI Probably secondary to hypoglycemia mediated osmotic diuresis Resolved with supportive care  Hypokalemia Replete/recheck  Constipation No BM x 1 week MiraLAX/senna Dulcolax suppository x 1 If no response-Fleet enema  Morbid Obesity: Estimated body mass index is 48.82 kg/m as calculated from the following:   Height as of this encounter: 5\' 3"  (1.6 m).   Weight as of this encounter: 125 kg.   Code status:   Code Status: Full Code   DVT Prophylaxis: enoxaparin (LOVENOX) injection 40 mg Start: 01/05/24 1000   Family Communication: None at bedside   Disposition Plan: Status is: Inpatient Remains inpatient appropriate because: Severity of illness   Planned Discharge Destination:Home in the next several days.   Diet: Diet Order             Diet regular Fluid consistency: Thin  Diet effective now                     Antimicrobial agents: Anti-infectives (From admission, onward)    None        MEDICATIONS: Scheduled Meds:  bisacodyl  10 mg Rectal Once   enoxaparin (LOVENOX) injection  40 mg Subcutaneous Q24H   polyethylene glycol  17 g Oral BID   senna-docusate  2 tablet Oral QHS   Continuous Infusions:  insulin 2.8 Units/hr (01/06/24 0700)   PRN Meds:.acetaminophen , dextrose , ondansetron  (ZOFRAN ) IV   I have personally reviewed following labs and imaging studies  LABORATORY DATA: CBC: Recent Labs  Lab 01/04/24 2105 01/04/24 2235 01/05/24 0153 01/05/24 0700  WBC 14.7*  --  16.1* 19.7*  NEUTROABS  --   --   --  15.0*  HGB 13.6 14.6 12.3 12.5  HCT 42.6 43.0 38.0 38.6  MCV 69.8*  --  69.6* 69.7*  PLT 426*  --  338 408*    Basic  Metabolic Panel: Recent Labs  Lab 01/05/24 0153 01/05/24 0605 01/05/24 1432 01/05/24 1740 01/06/24 0555  NA 143 150* 148* 148* 144  K 3.3* 3.5 4.4 3.9 3.1*  CL 103 108 111 112* 109  CO2 25 29 30 23 28   GLUCOSE 678* 296* 145* 133* 150*  BUN 17 16 16 16 14   CREATININE 1.40* 1.13* 0.88 0.93 0.90  CALCIUM 10.4* 10.8* 9.9 9.5 8.8*    GFR: Estimated Creatinine Clearance: 103.6 mL/min (by C-G formula based on SCr of 0.9 mg/dL).  Liver Function Tests: Recent Labs  Lab 01/04/24 2105  AST 24  ALT 41  ALKPHOS 129*  BILITOT 1.2  PROT 9.0*  ALBUMIN 4.1   Recent Labs  Lab 01/04/24 2230  LIPASE 143*   No results for input(s): "AMMONIA" in the last 168 hours.  Coagulation Profile: No results for input(s): "INR", "PROTIME" in the last 168 hours.  Cardiac Enzymes: No results for input(s): "CKTOTAL", "CKMB", "CKMBINDEX", "TROPONINI" in the last 168 hours.  BNP (last 3 results) No results for input(s): "PROBNP" in the last 8760 hours.  Lipid Profile: No results for input(s): "CHOL", "HDL", "LDLCALC", "TRIG", "CHOLHDL", "LDLDIRECT" in the last 72 hours.  Thyroid Function Tests: No results for input(s): "TSH", "T4TOTAL", "FREET4", "T3FREE", "THYROIDAB" in the last 72 hours.  Anemia Panel: No results for input(s): "VITAMINB12", "FOLATE", "FERRITIN", "TIBC", "IRON ", "RETICCTPCT" in the last 72 hours.  Urine analysis:    Component Value Date/Time   COLORURINE STRAW (A) 01/04/2024 2041   APPEARANCEUR CLEAR 01/04/2024 2041   LABSPEC 1.026 01/04/2024 2041   PHURINE 5.0 01/04/2024 2041   GLUCOSEU >=500 (A) 01/04/2024 2041   HGBUR NEGATIVE 01/04/2024 2041   BILIRUBINUR NEGATIVE 01/04/2024 2041   BILIRUBINUR NEGATIVE 08/19/2013 1215   KETONESUR 5 (A) 01/04/2024 2041   PROTEINUR NEGATIVE 01/04/2024 2041   UROBILINOGEN 1.0 08/22/2013 2122   NITRITE NEGATIVE 01/04/2024 2041   LEUKOCYTESUR NEGATIVE 01/04/2024 2041    Sepsis Labs: Lactic Acid, Venous No results found for:  "LATICACIDVEN"  MICROBIOLOGY: No results found for this or any previous visit (from the past 240 hours).  RADIOLOGY STUDIES/RESULTS: No results found.   LOS: 1 day   Kimberly Penna, MD  [facility]    To contact the attending provider between 7A-7P or the covering provider during after hours 7P-7A, please log into the web site [email or web address] and access using universal [place] password for that web site. If you do not have the password, please call the hospital operator.  01/06/2024, 9:08 AM

## 2024-01-06 NOTE — Inpatient Diabetes Management (Signed)
 Inpatient Diabetes Program Recommendations  AACE/ADA: New Consensus Statement on Inpatient Glycemic Control (2015)  Target Ranges:  Prepandial:   less than 140 mg/dL      Peak postprandial:   less than 180 mg/dL (1-2 hours)      Critically ill patients:  140 - 180 mg/dL   Lab Results  Component Value Date   GLUCAP 211 (H) 01/06/2024   HGBA1C 12.3 (H) 01/04/2024    Review of Glycemic Control  Diabetes history: New-onset DM Outpatient Diabetes medications: None Current orders for Inpatient glycemic control: Lantus 12 daily, Novolog 0-9 TID with meals  Hypoglycemia of 46, 59 mg/dL this afternoon at 4098. Pt received 40 (20+20) units of Semglee on 5/10 and IV insulin was also continued until 1430 today when pt had hypoglycemia. Received Lantus 12 units @ 1812. BHB this am normal at 0.07, although K+ had dropped to 3.1.  Inpatient Diabetes Program Recommendations:    Will likely need meal coverage insulin - 4 units TID with meals if eating > 50%.  Will need affordable insulin at discharge with no insurance.  Also f/u with [facility] for diabetes management.  Inpatient Diabetes Coordinator to see in am.   Thank you. Joni Net, RD, LDN, CDCES Inpatient Diabetes Coordinator [phone]

## 2024-01-06 NOTE — Plan of Care (Signed)
 Blood sugar is getting more within normal.

## 2024-01-07 ENCOUNTER — Telehealth (HOSPITAL_COMMUNITY): Payer: Self-pay | Admitting: Pharmacy Technician

## 2024-01-07 ENCOUNTER — Other Ambulatory Visit (HOSPITAL_COMMUNITY): Payer: Self-pay

## 2024-01-07 DIAGNOSIS — E1100 Type 2 diabetes mellitus with hyperosmolarity without nonketotic hyperglycemic-hyperosmolar coma (NKHHC): Secondary | ICD-10-CM | POA: Diagnosis not present

## 2024-01-07 DIAGNOSIS — N179 Acute kidney failure, unspecified: Secondary | ICD-10-CM | POA: Diagnosis not present

## 2024-01-07 LAB — BASIC METABOLIC PANEL WITH GFR
Anion gap: 8 (ref 5–15)
BUN: 10 mg/dL (ref 6–20)
CO2: 24 mmol/L (ref 22–32)
Calcium: 8.4 mg/dL — ABNORMAL LOW (ref 8.9–10.3)
Chloride: 105 mmol/L (ref 98–111)
Creatinine, Ser: 0.79 mg/dL (ref 0.44–1.00)
GFR, Estimated: >60 mL/min (ref >60)
Glucose, Bld: 300 mg/dL — ABNORMAL HIGH (ref 70–99)
Potassium: 3.7 mmol/L (ref 3.5–5.1)
Sodium: 137 mmol/L (ref 135–145)

## 2024-01-07 LAB — CBC
HCT: 33.4 % — ABNORMAL LOW (ref 36.0–46.0)
Hemoglobin: 10.8 g/dL — ABNORMAL LOW (ref 12.0–15.0)
MCH: 22.9 pg — ABNORMAL LOW (ref 26.0–34.0)
MCHC: 32.3 g/dL (ref 30.0–36.0)
MCV: 70.9 fL — ABNORMAL LOW (ref 80.0–100.0)
Platelets: 280 10*3/uL (ref 150–400)
RBC: 4.71 MIL/uL (ref 3.87–5.11)
RDW: 16.1 % — ABNORMAL HIGH (ref 11.5–15.5)
WBC: 9.2 10*3/uL (ref 4.0–10.5)
nRBC: 0 % (ref 0.0–0.2)

## 2024-01-07 LAB — GLUCOSE, CAPILLARY
Glucose-Capillary: 303 mg/dL — ABNORMAL HIGH (ref 70–99)
Glucose-Capillary: 324 mg/dL — ABNORMAL HIGH (ref 70–99)
Glucose-Capillary: 372 mg/dL — ABNORMAL HIGH (ref 70–99)
Glucose-Capillary: 325 mg/dL — ABNORMAL HIGH (ref 70–99)

## 2024-01-07 LAB — MAGNESIUM: Magnesium: 1.6 mg/dL — ABNORMAL LOW (ref 1.7–2.4)

## 2024-01-07 MED ORDER — INSULIN ASPART 100 UNIT/ML IJ SOLN
4.0000 [IU] | Freq: Three times a day (TID) | INTRAMUSCULAR | Status: DC
  Administered 2024-01-07 – 2024-01-08 (×4): 4 [IU] via SUBCUTANEOUS

## 2024-01-07 MED ORDER — MAGNESIUM SULFATE 2 GM/50ML IV SOLN
2.0000 g | Freq: Once | INTRAVENOUS | Status: AC
Start: 2024-01-07 — End: 2024-01-07
  Administered 2024-01-07: 2 g via INTRAVENOUS
  Filled 2024-01-07: qty 50

## 2024-01-07 MED ORDER — FLEET ENEMA RE ENEM: 1.0000 | ENEMA | Freq: Every day | RECTAL | Status: DC | PRN

## 2024-01-07 MED ORDER — INSULIN GLARGINE-YFGN 100 UNIT/ML ~~LOC~~ SOLN
20.0000 [IU] | Freq: Every day | SUBCUTANEOUS | Status: DC
  Administered 2024-01-07: 20 [IU] via SUBCUTANEOUS
  Filled 2024-01-07 (×3): qty 0.2

## 2024-01-07 NOTE — Inpatient Diabetes Management (Addendum)
 Inpatient Diabetes Program Recommendations  AACE/ADA: New Consensus Statement on Inpatient Glycemic Control (2015)  Target Ranges:  Prepandial:   less than 140 mg/dL      Peak postprandial:   less than 180 mg/dL (1-2 hours)      Critically ill patients:  140 - 180 mg/dL   Lab Results  Component Value Date   GLUCAP 303 (H) 01/07/2024   HGBA1C 12.3 (H) 01/04/2024    Review of Glycemic Control  Latest Reference Range & Units 01/07/24 08:43  Glucose-Capillary 70 - 99 mg/dL 098 (H)  (H): Data is abnormally high  Diabetes history: New DM2  Current orders for Inpatient glycemic control: Semglee 20 units every day, Novolog 0-9 units TID and 4 units [facility]IDMC  Met with patient at bedside.  She has been feeling unwell for a couple of weeks.  She has had increased thirst, urination and extreme fatigue.  Presents to the ED with HHS.  Glucose was 974 mg/dL.    Discussed A1C results with her (12.3-average BG of 306 mg/dL) and explained what an A1C is, basic pathophysiology of DM Type 2, basic home care, basic diabetes diet nutrition principles, importance of checking CBGs and maintaining good CBG control to prevent long-term and short-term complications. Reviewed signs and symptoms of hyperglycemia and hypoglycemia and how to treat hypoglycemia at home. Also reviewed blood sugar goals at home.  RNs to provide ongoing basic DM education at bedside with this patient. Have ordered educational booklet, insulin starter kit, and DM videos. Have also placed RD consult for DM diet education for this patient.   Educated patient on insulin pen use at home. Reviewed contents of insulin flexpen starter kit. Reviewed all steps of insulin pen including attachment of needle, 2-unit air shot, dialing up dose, giving injection, removing needle, disposal of sharps, storage of unused insulin, disposal of insulin etc. Patient able to provide successful return demonstration. Also reviewed troubleshooting with insulin pen. MD  to give patient Rxs for insulin pens and insulin pen needles.  Educated on The Plate Method, CHO's, portion control, avoiding caloric beverages, CBGs at home fasting and mid afternoon, F/U with PCP every 3 months, bring meter to PCP office, long and short term complications of uncontrolled BG, and importance of exercise.  Reviewed hypoglycemia, < 70 mg/dL, signs, symptoms and treatments.   Novolog and Lantus are covered with her insurance at $40 each.  Will provide her with a Lilly coupon for [employer] and Humalog for $35 each.   Dexcom CGM requires a PA-have asked our [facility] pharmacy to start a PA.  Received VO to place a Dexcom CGM on her prior to DC.  PA was complete and will cost $94.  Will place a Freestyle Libre 3 as it has a built in coupon card at the pharmacy for $75.  Placed FSL 3 on back of left arm.  Educated patient on how to use, alarms, waterproof, how to remove.    Will continue to follow while inpatient.  Thank you, Hays Lipschutz, MSN, CDCES Diabetes Coordinator Inpatient Diabetes Program [phone] (team pager from 8a-5p)   Discharge Recommendations: Other recommendations: Dexcom G7 Ordered # [id number] Long acting recommendations: Insulin Glargine (LANTUS) Solostar Pen 25 units QAM  Short acting recommendations:  Meal + Correction coverage Insulin aspart (NOVOLOG) FlexPen  Sensitive Scale.  Novolog 4 units TID with meals Supply/Referral recommendations: Glucometer Test strips Lancet device Lancets Pen needles - standard   Use Adult Diabetes Insulin Treatment Post Discharge order set.

## 2024-01-07 NOTE — Progress Notes (Signed)

## 2024-01-07 NOTE — Telephone Encounter (Signed)
 Pharmacy Patient Advocate Encounter  Received notification from [facility] that Prior Authorization for Dexcom G7 Sensor  has been APPROVED from 01/07/2024 to 01/06/2025. Ran test claim, Copay is $94.82. This test claim was processed through [facility]- copay amounts may vary at other pharmacies due to pharmacy/plan contracts, or as the patient moves through the different stages of their insurance plan.   PA #/Case ID/Reference #: [id number]

## 2024-01-07 NOTE — Progress Notes (Signed)
   01/07/24 1452  TOC Brief Assessment  Insurance and Status Reviewed (BCBS COMM PPO)  Patient has primary care physician Yes ([facility], [facility])  Home environment has been reviewed from home  Prior level of function: independent  Prior/Current Home Services No current home services  Social Drivers of Health Review SDOH reviewed interventions complete (Smoking cessation information attached)  Readmission risk has been reviewed Yes (13%)  Transition of care needs no transition of care needs at this time   No TOC needs identified at this time but please place [facility] consult should needs arise

## 2024-01-07 NOTE — Telephone Encounter (Addendum)
 Patient Product/process development scientist completed.    The patient is insured through [facility]. Patient has [employer], may use a copay card, and/or apply for patient assistance if available.    Ran test claim for Lantus Pen and the current 30 day co-pay is $40.00.  Ran test claim for Novolog FlexPen and the current 30 day co-pay is $40.00.  Ran test claim for Dexcom G7 Sensor and Requires Prior Authorization  Ran test claim for [employer] 3 Plus Sensor and Requires Prior Authorization  This test claim was processed through [employer]- copay amounts may vary at other pharmacies due to [employer], or as the patient moves through the different stages of their insurance plan.     Morgan Arab, CPHT Pharmacy Technician III Certified Patient Advocate [facility] Pharmacy Patient Advocate Team Direct Number: [phone]  Fax: [phone]

## 2024-01-07 NOTE — Telephone Encounter (Signed)
 Pharmacy Patient Advocate Encounter   Received notification from Inpatient Request that prior authorization for Dexcom G7 Sensor is required/requested.   Insurance verification completed.   The patient is insured through [facility] .   Per test claim: PA required; PA submitted to above mentioned insurance via CoverMyMeds Key/confirmation #/EOC [id number] Status is pending

## 2024-01-07 NOTE — Plan of Care (Signed)
  Problem: Education: Goal: Ability to describe self-care measures that may prevent or decrease complications (Diabetes Survival Skills Education) will improve Outcome: Progressing Goal: Individualized Educational Video(s) Outcome: Progressing   Problem: Coping: Goal: Ability to adjust to condition or change in health will improve Outcome: Progressing   Problem: Fluid Volume: Goal: Ability to maintain a balanced intake and output will improve Outcome: Progressing   Problem: Health Behavior/Discharge Planning: Goal: Ability to identify and utilize available resources and services will improve Outcome: Progressing Goal: Ability to manage health-related needs will improve Outcome: Progressing   Problem: Metabolic: Goal: Ability to maintain appropriate glucose levels will improve Outcome: Progressing   Problem: Nutritional: Goal: Maintenance of adequate nutrition will improve Outcome: Progressing Goal: Progress toward achieving an optimal weight will improve Outcome: Progressing   Problem: Skin Integrity: Goal: Risk for impaired skin integrity will decrease Outcome: Progressing   Problem: Tissue Perfusion: Goal: Adequacy of tissue perfusion will improve Outcome: Progressing   Problem: Education: Goal: Ability to describe self-care measures that may prevent or decrease complications (Diabetes Survival Skills Education) will improve Outcome: Progressing Goal: Individualized Educational Video(s) Outcome: Progressing   Problem: Cardiac: Goal: Ability to maintain an adequate cardiac output will improve Outcome: Progressing   Problem: Health Behavior/Discharge Planning: Goal: Ability to identify and utilize available resources and services will improve Outcome: Progressing Goal: Ability to manage health-related needs will improve Outcome: Progressing

## 2024-01-07 NOTE — Plan of Care (Signed)
 Patient just needs diabetes educator to see her.

## 2024-01-07 NOTE — Progress Notes (Addendum)
 PROGRESS NOTE        PATIENT DETAILS Name: Megan [name] Age: 43 y.o. Sex: female Date of Birth: 04/21/1981 Admit Date: 01/04/2024 Admitting Physician Kenny Peals, MD PCP:[facility], [facility]  Brief Summary: Patient is a 43 y.o.  female presented with nausea/vomiting/polyuria-found to have new onset DM-2 with HHS.  Significant events: 5/9>> admit to [facility]  Significant studies: None  Significant microbiology data: None  Procedures: None  Consults: None  Subjective: No vomiting-tolerating full liquids.  Feels better.  Objective: Vitals: Blood pressure 129/79, pulse 82, temperature 98.2 F (36.8 C), temperature source Oral, resp. rate 20, height 5\' 3"  (1.6 m), weight 125 kg, last menstrual period 12/24/2023, SpO2 95%, currently breastfeeding.   Exam: Awake/alert Chest: Clear to auscultation Abdomen: Soft nontender nondistended Extremity: No edema Nonfocal exam.  Pertinent Labs/Radiology:    Latest Ref Rng & Units 01/07/2024    4:57 AM 01/05/2024    7:00 AM 01/05/2024    1:53 AM  CBC  WBC 4.0 - 10.5 K/uL 9.2  19.7  16.1   Hemoglobin 12.0 - 15.0 g/dL 16.1  09.6  04.5   Hematocrit 36.0 - 46.0 % 33.4  38.6  38.0   Platelets 150 - 400 K/uL 280  408  338     Lab Results  Component Value Date   NA 137 01/07/2024   K 3.7 01/07/2024   CL 105 01/07/2024   CO2 24 01/07/2024      Assessment/Plan: HHS Resolved with insulin infusion/IV fluids.    New onset DM-2 (A1c 12.3 on 5/9) No longer on insulin fusion-has been transitioned to SQ insulin Increase Semglee to 20 units daily-add 4 units of NovoLog with meals-continue SSI As diet is advanced-probably will require more adjustment of insulin regimen.  Monitor closely overnight-likely home 5/13 if CBG stable/tolerating advancement in diet. Diabetic coordinator following-diabetic education/insulin education ongoing.  Recent Labs    01/06/24 1759 01/06/24 2114 01/07/24 0843  GLUCAP  211* 389* 303*     Vomiting Resolved Tolerating full liquids Advance to diabetic/regular diet today Since resolved-doubt any further workup required.  AKI Probably secondary to hypoglycemia mediated osmotic diuresis Resolved with supportive care  Hypokalemia Repleted  Hypomagnesemia Replete/recheck.  Constipation Continue/senna If no BM-Dulcolax suppository or Fleet enema.  Morbid Obesity: Estimated body mass index is 48.82 kg/m as calculated from the following:   Height as of this encounter: 5\' 3"  (1.6 m).   Weight as of this encounter: 125 kg.   Code status:   Code Status: Full Code   DVT Prophylaxis: enoxaparin (LOVENOX) injection 40 mg Start: 01/05/24 1000   Family Communication: None at bedside   Disposition Plan: Status is: Inpatient Remains inpatient appropriate because: Severity of illness   Planned Discharge Destination:Home tomorrow if stable-tolerating advancement in diet   Diet: Diet Order             Diet Carb Modified Fluid consistency: Thin; Room service appropriate? Yes  Diet effective now                     Antimicrobial agents: Anti-infectives (From admission, onward)    None        MEDICATIONS: Scheduled Meds:  enoxaparin (LOVENOX) injection  40 mg Subcutaneous Q24H   insulin aspart  0-9 Units Subcutaneous TID WC   insulin aspart  4 Units Subcutaneous TID WC  insulin glargine-yfgn  20 Units Subcutaneous Daily   polyethylene glycol  17 g Oral BID   senna-docusate  2 tablet Oral QHS   Continuous Infusions:   PRN Meds:.acetaminophen , dextrose , ondansetron  (ZOFRAN ) IV, sodium phosphate    I have personally reviewed following labs and imaging studies  LABORATORY DATA: CBC: Recent Labs  Lab 01/04/24 2105 01/04/24 2235 01/05/24 0153 01/05/24 0700 01/07/24 0457  WBC 14.7*  --  16.1* 19.7* 9.2  NEUTROABS  --   --   --  15.0*  --   HGB 13.6 14.6 12.3 12.5 10.8*  HCT 42.6 43.0 38.0 38.6 33.4*  MCV 69.8*  --   69.6* 69.7* 70.9*  PLT 426*  --  338 408* 280    Basic Metabolic Panel: Recent Labs  Lab 01/05/24 0605 01/05/24 1432 01/05/24 1740 01/06/24 0555 01/07/24 0457  NA 150* 148* 148* 144 137  K 3.5 4.4 3.9 3.1* 3.7  CL 108 111 112* 109 105  CO2 29 30 23 28 24   GLUCOSE 296* 145* 133* 150* 300*  BUN 16 16 16 14 10   CREATININE 1.13* 0.88 0.93 0.90 0.79  CALCIUM 10.8* 9.9 9.5 8.8* 8.4*  MG  --   --   --   --  1.6*    GFR: Estimated Creatinine Clearance: 116.5 mL/min (by C-G formula based on SCr of 0.79 mg/dL).  Liver Function Tests: Recent Labs  Lab 01/04/24 2105  AST 24  ALT 41  ALKPHOS 129*  BILITOT 1.2  PROT 9.0*  ALBUMIN 4.1   Recent Labs  Lab 01/04/24 2230  LIPASE 143*   No results for input(s): "AMMONIA" in the last 168 hours.  Coagulation Profile: No results for input(s): "INR", "PROTIME" in the last 168 hours.  Cardiac Enzymes: No results for input(s): "CKTOTAL", "CKMB", "CKMBINDEX", "TROPONINI" in the last 168 hours.  BNP (last 3 results) No results for input(s): "PROBNP" in the last 8760 hours.  Lipid Profile: No results for input(s): "CHOL", "HDL", "LDLCALC", "TRIG", "CHOLHDL", "LDLDIRECT" in the last 72 hours.  Thyroid Function Tests: No results for input(s): "TSH", "T4TOTAL", "FREET4", "T3FREE", "THYROIDAB" in the last 72 hours.  Anemia Panel: No results for input(s): "VITAMINB12", "FOLATE", "FERRITIN", "TIBC", "IRON ", "RETICCTPCT" in the last 72 hours.  Urine analysis:    Component Value Date/Time   COLORURINE STRAW (A) 01/04/2024 2041   APPEARANCEUR CLEAR 01/04/2024 2041   LABSPEC 1.026 01/04/2024 2041   PHURINE 5.0 01/04/2024 2041   GLUCOSEU >=500 (A) 01/04/2024 2041   HGBUR NEGATIVE 01/04/2024 2041   BILIRUBINUR NEGATIVE 01/04/2024 2041   BILIRUBINUR NEGATIVE 08/19/2013 1215   KETONESUR 5 (A) 01/04/2024 2041   PROTEINUR NEGATIVE 01/04/2024 2041   UROBILINOGEN 1.0 08/22/2013 2122   NITRITE NEGATIVE 01/04/2024 2041   LEUKOCYTESUR  NEGATIVE 01/04/2024 2041    Sepsis Labs: Lactic Acid, Venous No results found for: "LATICACIDVEN"  MICROBIOLOGY: No results found for this or any previous visit (from the past 240 hours).  RADIOLOGY STUDIES/RESULTS: No results found.   LOS: 2 days   Kimberly Penna, MD  [facility]    To contact the attending provider between 7A-7P or the covering provider during after hours 7P-7A, please log into the web site [email or web address] and access using universal [place] password for that web site. If you do not have the password, please call the hospital operator.  01/07/2024, 10:15 AM

## 2024-01-08 ENCOUNTER — Other Ambulatory Visit (HOSPITAL_COMMUNITY): Payer: Self-pay

## 2024-01-08 DIAGNOSIS — E1100 Type 2 diabetes mellitus with hyperosmolarity without nonketotic hyperglycemic-hyperosmolar coma (NKHHC): Secondary | ICD-10-CM | POA: Diagnosis not present

## 2024-01-08 DIAGNOSIS — N179 Acute kidney failure, unspecified: Secondary | ICD-10-CM | POA: Diagnosis not present

## 2024-01-08 LAB — MAGNESIUM: Magnesium: 1.8 mg/dL (ref 1.7–2.4)

## 2024-01-08 LAB — GLUCOSE, CAPILLARY
Glucose-Capillary: 250 mg/dL — ABNORMAL HIGH (ref 70–99)
Glucose-Capillary: 289 mg/dL — ABNORMAL HIGH (ref 70–99)

## 2024-01-08 MED ORDER — ACCU-CHEK SOFTCLIX LANCETS MISC
1.0000 | Freq: Three times a day (TID) | 0 refills | Status: AC
  Filled 2024-01-08: qty 100, 30d supply, fill #0

## 2024-01-08 MED ORDER — INSULIN ASPART 100 UNIT/ML FLEXPEN
PEN_INJECTOR | SUBCUTANEOUS | 1 refills | Status: AC
  Filled 2024-01-08: qty 12, 30d supply, fill #0

## 2024-01-08 MED ORDER — PANTOPRAZOLE SODIUM 40 MG PO TBEC
40.0000 mg | DELAYED_RELEASE_TABLET | Freq: Every day | ORAL | Status: DC
  Administered 2024-01-08: 40 mg via ORAL
  Filled 2024-01-08: qty 1

## 2024-01-08 MED ORDER — INSULIN ASPART 100 UNIT/ML IJ SOLN
0.0000 [IU] | Freq: Three times a day (TID) | INTRAMUSCULAR | Status: DC
  Administered 2024-01-08: 8 [IU] via SUBCUTANEOUS

## 2024-01-08 MED ORDER — INSULIN GLARGINE-YFGN 100 UNIT/ML ~~LOC~~ SOLN
32.0000 [IU] | Freq: Every day | SUBCUTANEOUS | Status: DC
Start: 2024-01-08 — End: 2024-01-08

## 2024-01-08 MED ORDER — ALUM & MAG HYDROXIDE-SIMETH 200-200-20 MG/5ML PO SUSP
30.0000 mL | Freq: Four times a day (QID) | ORAL | Status: DC | PRN
  Administered 2024-01-08 (×2): 30 mL via ORAL
  Filled 2024-01-08: qty 30

## 2024-01-08 MED ORDER — INSULIN PEN NEEDLE 32G X 4 MM MISC
1.0000 | 0 refills | Status: AC | PRN
  Filled 2024-01-08: qty 100, 30d supply, fill #0

## 2024-01-08 MED ORDER — DEXCOM G7 RECEIVER DEVI
1 refills | Status: AC
Start: 2024-01-08 — End: ?
  Filled 2024-01-08: qty 1, 30d supply, fill #0

## 2024-01-08 MED ORDER — METFORMIN HCL 500 MG PO TABS
500.0000 mg | ORAL_TABLET | Freq: Two times a day (BID) | ORAL | 1 refills | Status: AC
  Filled 2024-01-08: qty 60, 30d supply, fill #0

## 2024-01-08 MED ORDER — METFORMIN HCL 500 MG PO TABS
500.0000 mg | ORAL_TABLET | Freq: Two times a day (BID) | ORAL | Status: DC
  Administered 2024-01-08: 500 mg via ORAL
  Filled 2024-01-08: qty 1

## 2024-01-08 MED ORDER — INSULIN GLARGINE-YFGN 100 UNIT/ML ~~LOC~~ SOLN
30.0000 [IU] | Freq: Every day | SUBCUTANEOUS | Status: DC
Start: 2024-01-08 — End: 2024-01-08

## 2024-01-08 MED ORDER — POLYETHYLENE GLYCOL 3350 17 GM/SCOOP PO POWD
17.0000 g | Freq: Every day | ORAL | 1 refills | Status: AC
  Filled 2024-01-08: qty 238, 14d supply, fill #0

## 2024-01-08 MED ORDER — BLOOD GLUCOSE TEST VI STRP
1.0000 | ORAL_STRIP | Freq: Three times a day (TID) | 0 refills | Status: AC
  Filled 2024-01-08: qty 100, 30d supply, fill #0

## 2024-01-08 MED ORDER — INSULIN GLARGINE 100 UNIT/ML SOLOSTAR PEN: 30.0000 [IU] | PEN_INJECTOR | Freq: Every day | SUBCUTANEOUS | 1 refills | Status: DC

## 2024-01-08 MED ORDER — INSULIN GLARGINE-YFGN 100 UNIT/ML ~~LOC~~ SOLN
30.0000 [IU] | Freq: Every day | SUBCUTANEOUS | Status: DC
  Administered 2024-01-08: 30 [IU] via SUBCUTANEOUS
  Filled 2024-01-08: qty 0.3

## 2024-01-08 MED ORDER — FAMOTIDINE 20 MG PO TABS
20.0000 mg | ORAL_TABLET | Freq: Every day | ORAL | 0 refills | Status: AC
  Filled 2024-01-08: qty 30, 30d supply, fill #0

## 2024-01-08 MED ORDER — DEXCOM G7 SENSOR MISC
1 refills | Status: AC
  Filled 2024-01-08: qty 3, 30d supply, fill #0

## 2024-01-08 MED ORDER — INSULIN GLARGINE 100 UNIT/ML SOLOSTAR PEN
30.0000 [IU] | PEN_INJECTOR | Freq: Every day | SUBCUTANEOUS | 1 refills | Status: AC
  Filled 2024-01-08: qty 15, 50d supply, fill #0

## 2024-01-08 MED ORDER — SENNOSIDES-DOCUSATE SODIUM 8.6-50 MG PO TABS
2.0000 | ORAL_TABLET | Freq: Every day | ORAL | 1 refills | Status: AC
  Filled 2024-01-08: qty 60, 30d supply, fill #0

## 2024-01-08 MED ORDER — LANCET DEVICE MISC
1.0000 | Freq: Three times a day (TID) | 0 refills | Status: AC
  Filled 2024-01-08: qty 1, 30d supply, fill #0

## 2024-01-08 MED ORDER — BLOOD GLUCOSE MONITOR SYSTEM W/DEVICE KIT
1.0000 | PACK | Freq: Three times a day (TID) | 0 refills | Status: AC
  Filled 2024-01-08: qty 1, 30d supply, fill #0

## 2024-01-08 NOTE — TOC Transition Note (Signed)
 Transition of Care [facility]) - Discharge Note   Patient Details  Name: Megan [name] MRN: [id number] Date of Birth: [birth year]  [facility]) CM/SW Contact:  Eusebio High, RN Phone Number: 01/08/2024, 11:36 AM   Clinical Narrative:     Patient will DC to home today. No TOC needs identified. Patient will follow up as directed on AVS              Patient Goals and CMS Choice            Discharge Placement                       Discharge Plan and Services Additional resources added to the After Visit Summary for                                       Social Drivers of Health (SDOH) Interventions SDOH Screenings   Food Insecurity: Food Insecurity Present (01/05/2024)  Housing: Low Risk  (01/05/2024)  Transportation Needs: No Transportation Needs (01/05/2024)  Utilities: Not At Risk (01/05/2024)  Alcohol Screen: Low Risk  (12/23/2017)  Tobacco Use: High Risk (01/04/2024)     Readmission Risk Interventions     No data to display

## 2024-01-08 NOTE — Discharge Summary (Addendum)
 PATIENT DETAILS Name: Megan [name] Age: 43 y.o. Sex: female Date of Birth: 1980-12-05 MRN: [id number]. Admitting Physician: Kenny Peals, MD PCP:[facility]  Admit Date: 01/04/2024 Discharge date: 01/08/2024  Recommendations for Outpatient Follow-up:  Follow up with PCP in 1-2 weeks Please obtain CMP/CBC in one week PCP to optimize diabetic regimen.  Admitted From:  Home  Disposition: Home   Discharge Condition: good  CODE STATUS:   Code Status: Full Code   Diet recommendation:  Diet Order             Diet Carb Modified Fluid consistency: Thin; Room service appropriate? Yes  Diet effective now                    Brief Summary: Patient is a 43 y.o.  female presented with nausea/vomiting/polyuria-found to have new onset DM-2 with HHS.   Significant events: 5/9>> admit to [facility]   Significant studies: None   Significant microbiology data: None   Procedures: None   Consults: None  Brief Hospital Course: HHS Resolved with insulin infusion/IV fluids.     New onset DM-2 (A1c 12.3 on 5/9) No longer on insulin fusion-has been transitioned to SQ insulin CBGs continue to fluctuate quite a bit-on the higher side-increase Semglee to 30 units, add metformin-continue SSI on discharge Patient has had extensive diabetic education with diabetic coordinator-I reinforced basal/bolus regimen-signs/symptoms of hypoglycemia and needed interventions. Stable for discharge-follow-up with outpatient PCP for further glycemic optimization.    Vomiting Resolved Tolerating full liquids Advance to diabetic/regular diet today Since resolved-doubt any further workup required.   AKI Probably secondary to hypoglycemia mediated osmotic diuresis Resolved with supportive care   Hypokalemia Repleted   Hypomagnesemia Replete/recheck.   Constipation Last BM couple days ago-continue/senna   Morbid Obesity: Estimated body mass index is 48.82 kg/m as  calculated from the following:   Height as of this encounter: 5\' 3"  (1.6 m).   Weight as of this encounter: 125 kg.  Discharge Diagnoses:  Principal Problem:   Type 2 diabetes mellitus with hyperosmolar hyperglycemic state (HHS) (HCC) Active Problems:   AKI (acute kidney injury) [facility])   Discharge Instructions:  Activity:  As tolerated    Allergies as of 01/08/2024   No Known Allergies      Medication List     TAKE these medications    Blood Glucose Monitoring Suppl Devi 1 each by Does not apply route in the morning, at noon, and at bedtime. May substitute to any manufacturer covered by patient's insurance.   BLOOD GLUCOSE TEST STRIPS Strp 1 each by In Vitro route in the morning, at noon, and at bedtime. May substitute to any manufacturer covered by patient's insurance.   Dexcom G7 Receiver Devi USE AS INSTRUCTED   Dexcom G7 Sensor Misc USE AS INSTRUCTED   famotidine  20 MG tablet Commonly known as: Pepcid  Take 1 tablet (20 mg total) by mouth daily.   insulin aspart 100 UNIT/ML FlexPen Commonly known as: NOVOLOG 0-20 Units, Subcutaneous, 3 times daily with meals CBG < 70: Implement Hypoglycemia measures, call MD,CBG 70 - 120: 0 units CBG 121 - 150: 2 units CBG 151 - 200: 3 units CBG 201 - 250: 5 units CBG 251 - 300: 8 units CBG 301 - 350: 11 units CBG 351 - 400: 15 units CBG > 400: call MD   insulin glargine 100 UNIT/ML Solostar Pen Commonly known as: LANTUS Inject 30 Units into the skin daily.   Insulin Pen Needle 32G  X 4 MM Misc 1 each by Does not apply route as needed.   Lancet Device Misc 1 each by Does not apply route in the morning, at noon, and at bedtime. May substitute to any manufacturer covered by patient's insurance.   Lancets Misc. Misc 1 each by Does not apply route in the morning, at noon, and at bedtime. May substitute to any manufacturer covered by patient's insurance.   metFORMIN 500 MG tablet Commonly known as: GLUCOPHAGE Take 1 tablet  (500 mg total) by mouth 2 (two) times daily with a meal.   polyethylene glycol 17 g packet Commonly known as: MIRALAX / GLYCOLAX Take 17 g by mouth daily.   senna-docusate 8.6-50 MG tablet Commonly known as: Senokot-S Take 2 tablets by mouth at bedtime.        Follow-up Information     [facility]. Schedule an appointment as soon as possible for a visit in 1 week(s).   Specialty: Internal Medicine Contact information: [address] [place] [address] [phone]                No Known Allergies   Other Procedures/Studies: No results found.   TODAY-DAY OF DISCHARGE:  Subjective:   Harman Lightning today has no headache,no chest abdominal pain,no new weakness tingling or numbness, feels much better wants to go home today.   Objective:   Blood pressure 123/89, pulse 74, temperature 98.1 F (36.7 C), temperature source Oral, resp. rate 18, height 5\' 3"  (1.6 m), weight 125 kg, last menstrual period 12/24/2023, SpO2 97%, currently breastfeeding. No intake or output data in the 24 hours ending 01/08/24 0948 Filed Weights   01/04/24 2038  Weight: 125 kg    Exam: Awake Alert, Oriented *3, No new F.N deficits, Normal affect [place].AT,PERRAL Supple Neck,No JVD, No cervical lymphadenopathy appriciated.  Symmetrical Chest wall movement, Good air movement bilaterally, CTAB RRR,No Gallops,Rubs or new Murmurs, No Parasternal Heave +ve B.Sounds, Abd Soft, Non tender, No organomegaly appriciated, No rebound -guarding or rigidity. No Cyanosis, Clubbing or edema, No new Rash or bruise   PERTINENT RADIOLOGIC STUDIES: No results found.   PERTINENT LAB RESULTS: CBC: Recent Labs    01/07/24 0457  WBC 9.2  HGB 10.8*  HCT 33.4*  PLT 280   CMET CMP     Component Value Date/Time   NA 137 01/07/2024 0457   K 3.7 01/07/2024 0457   CL 105 01/07/2024 0457   CO2 24 01/07/2024 0457   GLUCOSE 300 (H) 01/07/2024 0457   BUN 10 01/07/2024 0457    CREATININE 0.79 01/07/2024 0457   CREATININE 0.53 08/19/2013 1346   CALCIUM 8.4 (L) 01/07/2024 0457   PROT 9.0 (H) 01/04/2024 2105   ALBUMIN 4.1 01/04/2024 2105   AST 24 01/04/2024 2105   ALT 41 01/04/2024 2105   ALKPHOS 129 (H) 01/04/2024 2105   BILITOT 1.2 01/04/2024 2105   GFRNONAA >60 01/07/2024 0457    GFR Estimated Creatinine Clearance: 116.5 mL/min (by C-G formula based on SCr of 0.79 mg/dL). No results for input(s): "LIPASE", "AMYLASE" in the last 72 hours. No results for input(s): "CKTOTAL", "CKMB", "CKMBINDEX", "TROPONINI" in the last 72 hours. Invalid input(s): "POCBNP" No results for input(s): "DDIMER" in the last 72 hours. No results for input(s): "HGBA1C" in the last 72 hours. No results for input(s): "CHOL", "HDL", "LDLCALC", "TRIG", "CHOLHDL", "LDLDIRECT" in the last 72 hours. No results for input(s): "TSH", "T4TOTAL", "T3FREE", "THYROIDAB" in the last 72 hours.  Invalid input(s): "FREET3" No results for  input(s): "VITAMINB12", "FOLATE", "FERRITIN", "TIBC", "IRON ", "RETICCTPCT" in the last 72 hours. Coags: No results for input(s): "INR" in the last 72 hours.  Invalid input(s): "PT" Microbiology: No results found for this or any previous visit (from the past 240 hours).  FURTHER DISCHARGE INSTRUCTIONS:  Get Medicines reviewed and adjusted: Please take all your medications with you for your next visit with your Primary MD  Laboratory/radiological data: Please request your Primary MD to go over all hospital tests and procedure/radiological results at the follow up, please ask your Primary MD to get all Hospital records sent to his/her office.  In some cases, they will be blood work, cultures and biopsy results pending at the time of your discharge. Please request that your primary care M.D. goes through all the records of your hospital data and follows up on these results.  Also Note the following: If you experience worsening of your admission symptoms, develop  shortness of breath, life threatening emergency, suicidal or homicidal thoughts you must seek medical attention immediately by calling 911 or calling your MD immediately  if symptoms less severe.  You must read complete instructions/literature along with all the possible adverse reactions/side effects for all the Medicines you take and that have been prescribed to you. Take any new Medicines after you have completely understood and accpet all the possible adverse reactions/side effects.   Do not drive when taking Pain medications or sleeping medications (Benzodaizepines)  Do not take more than prescribed Pain, Sleep and Anxiety Medications. It is not advisable to combine anxiety,sleep and pain medications without talking with your primary care practitioner  Special Instructions: If you have smoked or chewed Tobacco  in the last 2 yrs please stop smoking, stop any regular Alcohol  and or any Recreational drug use.  Wear Seat belts while driving.  Please note: You were cared for by a hospitalist during your hospital stay. Once you are discharged, your primary care physician will handle any further medical issues. Please note that NO REFILLS for any discharge medications will be authorized once you are discharged, as it is imperative that you return to your primary care physician (or establish a relationship with a primary care physician if you do not have one) for your post hospital discharge needs so that they can reassess your need for medications and monitor your lab values.  Total Time spent coordinating discharge including counseling, education and face to face time equals greater than 30 minutes.  SignedKimberly Penna 01/08/2024 9:48 AM

## 2024-08-04 ENCOUNTER — Telehealth: Payer: Self-pay

## 2024-08-04 NOTE — Telephone Encounter (Signed)
 Patient was identified as falling into the [place] Measure - Diabetes.   Patient was: Patient is not currently using our practice.
